# Patient Record
Sex: Female | Born: 1940
Health system: Southern US, Community
[De-identification: ages and names within clinical notes are randomized; demographics above are authoritative.]

## PROBLEM LIST (undated history)

## (undated) DIAGNOSIS — E785 Hyperlipidemia, unspecified: Secondary | ICD-10-CM

## (undated) DIAGNOSIS — I10 Essential (primary) hypertension: Secondary | ICD-10-CM

## (undated) DIAGNOSIS — R079 Chest pain, unspecified: Secondary | ICD-10-CM

## (undated) HISTORY — DX: Chest pain, unspecified: R07.9

## (undated) HISTORY — DX: Hyperlipidemia, unspecified: E78.5

## (undated) HISTORY — DX: Essential (primary) hypertension: I10

---

## 2012-02-22 DIAGNOSIS — L981 Factitial dermatitis: Secondary | ICD-10-CM | POA: Diagnosis not present

## 2012-02-22 DIAGNOSIS — H01009 Unspecified blepharitis unspecified eye, unspecified eyelid: Secondary | ICD-10-CM | POA: Diagnosis not present

## 2012-03-07 DIAGNOSIS — E559 Vitamin D deficiency, unspecified: Secondary | ICD-10-CM | POA: Diagnosis not present

## 2012-03-07 DIAGNOSIS — Z23 Encounter for immunization: Secondary | ICD-10-CM | POA: Diagnosis not present

## 2012-03-07 DIAGNOSIS — E782 Mixed hyperlipidemia: Secondary | ICD-10-CM | POA: Diagnosis not present

## 2012-03-07 DIAGNOSIS — Z79899 Other long term (current) drug therapy: Secondary | ICD-10-CM | POA: Diagnosis not present

## 2012-03-07 DIAGNOSIS — E039 Hypothyroidism, unspecified: Secondary | ICD-10-CM | POA: Diagnosis not present

## 2012-03-11 DIAGNOSIS — Z1211 Encounter for screening for malignant neoplasm of colon: Secondary | ICD-10-CM | POA: Diagnosis not present

## 2012-03-21 DIAGNOSIS — Z1231 Encounter for screening mammogram for malignant neoplasm of breast: Secondary | ICD-10-CM | POA: Diagnosis not present

## 2012-03-29 DIAGNOSIS — Z1211 Encounter for screening for malignant neoplasm of colon: Secondary | ICD-10-CM | POA: Diagnosis not present

## 2012-03-31 DIAGNOSIS — R928 Other abnormal and inconclusive findings on diagnostic imaging of breast: Secondary | ICD-10-CM | POA: Diagnosis not present

## 2012-06-04 DIAGNOSIS — N3000 Acute cystitis without hematuria: Secondary | ICD-10-CM | POA: Diagnosis not present

## 2012-06-06 DIAGNOSIS — K648 Other hemorrhoids: Secondary | ICD-10-CM | POA: Diagnosis not present

## 2012-06-06 DIAGNOSIS — D126 Benign neoplasm of colon, unspecified: Secondary | ICD-10-CM | POA: Diagnosis not present

## 2012-06-06 DIAGNOSIS — K5730 Diverticulosis of large intestine without perforation or abscess without bleeding: Secondary | ICD-10-CM | POA: Diagnosis not present

## 2012-06-06 DIAGNOSIS — E039 Hypothyroidism, unspecified: Secondary | ICD-10-CM | POA: Diagnosis not present

## 2012-06-06 DIAGNOSIS — Z1211 Encounter for screening for malignant neoplasm of colon: Secondary | ICD-10-CM | POA: Diagnosis not present

## 2012-06-24 DIAGNOSIS — Z79899 Other long term (current) drug therapy: Secondary | ICD-10-CM | POA: Diagnosis not present

## 2012-06-24 DIAGNOSIS — E559 Vitamin D deficiency, unspecified: Secondary | ICD-10-CM | POA: Diagnosis not present

## 2012-10-20 DIAGNOSIS — L570 Actinic keratosis: Secondary | ICD-10-CM | POA: Diagnosis not present

## 2012-11-10 DIAGNOSIS — C4441 Basal cell carcinoma of skin of scalp and neck: Secondary | ICD-10-CM | POA: Diagnosis not present

## 2012-12-21 DIAGNOSIS — I209 Angina pectoris, unspecified: Secondary | ICD-10-CM | POA: Diagnosis not present

## 2013-01-25 DIAGNOSIS — R0602 Shortness of breath: Secondary | ICD-10-CM | POA: Diagnosis not present

## 2013-01-25 DIAGNOSIS — I10 Essential (primary) hypertension: Secondary | ICD-10-CM | POA: Diagnosis not present

## 2013-01-26 ENCOUNTER — Other Ambulatory Visit (HOSPITAL_COMMUNITY): Payer: Self-pay | Admitting: Cardiovascular Disease

## 2013-01-26 DIAGNOSIS — R079 Chest pain, unspecified: Secondary | ICD-10-CM

## 2013-01-26 DIAGNOSIS — R0602 Shortness of breath: Secondary | ICD-10-CM

## 2013-01-26 DIAGNOSIS — R0989 Other specified symptoms and signs involving the circulatory and respiratory systems: Secondary | ICD-10-CM

## 2013-01-31 ENCOUNTER — Ambulatory Visit (HOSPITAL_COMMUNITY)
Admission: RE | Admit: 2013-01-31 | Discharge: 2013-01-31 | Disposition: A | Discharge status: Home / Self Care | Payer: Medicare Other | Source: Ambulatory Visit | Attending: Cardiovascular Disease | Admitting: Cardiovascular Disease

## 2013-01-31 DIAGNOSIS — I519 Heart disease, unspecified: Secondary | ICD-10-CM | POA: Diagnosis not present

## 2013-01-31 DIAGNOSIS — I379 Nonrheumatic pulmonary valve disorder, unspecified: Secondary | ICD-10-CM | POA: Diagnosis not present

## 2013-01-31 DIAGNOSIS — I10 Essential (primary) hypertension: Secondary | ICD-10-CM | POA: Insufficient documentation

## 2013-01-31 DIAGNOSIS — F17200 Nicotine dependence, unspecified, uncomplicated: Secondary | ICD-10-CM | POA: Diagnosis not present

## 2013-01-31 DIAGNOSIS — R0602 Shortness of breath: Secondary | ICD-10-CM | POA: Diagnosis not present

## 2013-01-31 DIAGNOSIS — R0989 Other specified symptoms and signs involving the circulatory and respiratory systems: Secondary | ICD-10-CM | POA: Diagnosis not present

## 2013-01-31 DIAGNOSIS — R0609 Other forms of dyspnea: Secondary | ICD-10-CM | POA: Diagnosis not present

## 2013-01-31 NOTE — Progress Notes (Signed)
Carotid artery duplex doppler exam was completed. Larene Pickett RVT

## 2013-01-31 NOTE — Progress Notes (Signed)
2D Echo Performed 01/31/2013    Freeland Pracht, RCS  

## 2013-02-03 ENCOUNTER — Ambulatory Visit (HOSPITAL_COMMUNITY)
Admission: RE | Admit: 2013-02-03 | Discharge: 2013-02-03 | Disposition: A | Discharge status: Home / Self Care | Payer: Medicare Other | Source: Ambulatory Visit | Attending: Cardiovascular Disease | Admitting: Cardiovascular Disease

## 2013-02-03 DIAGNOSIS — R9439 Abnormal result of other cardiovascular function study: Secondary | ICD-10-CM | POA: Insufficient documentation

## 2013-02-03 DIAGNOSIS — R0602 Shortness of breath: Secondary | ICD-10-CM | POA: Diagnosis not present

## 2013-02-03 DIAGNOSIS — E663 Overweight: Secondary | ICD-10-CM | POA: Diagnosis not present

## 2013-02-03 DIAGNOSIS — R0989 Other specified symptoms and signs involving the circulatory and respiratory systems: Secondary | ICD-10-CM | POA: Insufficient documentation

## 2013-02-03 DIAGNOSIS — R079 Chest pain, unspecified: Secondary | ICD-10-CM | POA: Diagnosis not present

## 2013-02-03 DIAGNOSIS — I10 Essential (primary) hypertension: Secondary | ICD-10-CM | POA: Insufficient documentation

## 2013-02-03 DIAGNOSIS — R0609 Other forms of dyspnea: Secondary | ICD-10-CM | POA: Diagnosis not present

## 2013-02-03 MED ORDER — TECHNETIUM TC 99M SESTAMIBI GENERIC - CARDIOLITE
10.0000 | Freq: Once | INTRAVENOUS | Status: AC | PRN
  Administered 2013-02-03: 10 via INTRAVENOUS

## 2013-02-03 MED ORDER — TECHNETIUM TC 99M SESTAMIBI GENERIC - CARDIOLITE
30.0000 | Freq: Once | INTRAVENOUS | Status: AC | PRN
  Administered 2013-02-03: 30 via INTRAVENOUS

## 2013-02-03 NOTE — Procedures (Addendum)
[facility] [address] [place] [address] [place] [address] [phone]  Cardiology Nuclear Med Study  Elizabeth Brown is Brown 72 y.o. female     MRN : [id number]     DOB: 04-17-1941  Procedure Date: 02/03/2013  Nuclear Med Background Indication for Stress Test:  Evaluation for Ischemia History:  NO PRIOR CARDIAC HISTORY Cardiac Risk Factors: Family History - CAD, History of Smoking, Hypertension and Overweight  Symptoms:  Chest Pain, DOE and SOB   Nuclear Pre-Procedure Caffeine/Decaff Intake:  7:00pm NPO After: 5:00am   IV Site: R Antecubital  IV 0.9% NS with Angio Cath:  22g  Chest Size (in):  n/Brown IV Started by: Emmit Pomfret, RN  Height: 5\' 2"  (1.575 m)  Cup Size: D  BMI:  Body mass index is 25.97 kg/(m^2). Weight:  142 lb (64.411 kg)   Tech Comments:  N/Brown    Nuclear Med Study 1 or 2 day study: 1 day  Stress Test Type:  Stress  Order Authorizing Provider:  Nanetta Batty, MD   Resting Radionuclide: Technetium 28m Sestamibi  Resting Radionuclide Dose: 10.5 mCi   Stress Radionuclide:  Technetium 70m Sestamibi  Stress Radionuclide Dose: 31.0 mCi           Stress Protocol Rest HR: 73 Stress HR: 139  Rest BP: 172/79 Stress BP: 210/81  Exercise Time (min): 5:30 METS: 7.0   Predicted Max HR: 149 bpm % Max HR: 93.29 bpm Rate Pressure Product: 36644  Dose of Adenosine (mg):  n/Brown Dose of Lexiscan: n/Brown mg  Dose of Atropine (mg): n/Brown Dose of Dobutamine: n/Brown mcg/kg/min (at max HR)  Stress Test Technologist: Esperanza Sheets, CCT Nuclear Technologist: Koren Shiver, CNMT   Rest Procedure:  Myocardial perfusion imaging was performed at rest 45 minutes following the intravenous administration of Technetium 19m Sestamibi. Stress Procedure:  The patient performed treadmill exercise using Brown Bruce  Protocol for 5:30 minutes. The patient stopped due to SOB and Chest Pain. There were significant ST-T wave changes.  Technetium 57m Sestamibi was  injected at peak exercise and myocardial perfusion imaging was performed after Brown brief delay.  Transient Ischemic Dilatation (Normal <1.22):  1.03 Lung/Heart Ratio (Normal <0.45):  0.34 QGS EDV:  36 ml QGS ESV:  5 ml LV Ejection Fraction: 85%  Signed by      Rest ECG: NSR - Normal EKG  Stress ECG: 1-2 mm horizontal to upsloping ST depression which became downsloping in recovery.  QPS Raw Data Images:  Normal; no motion artifact; normal heart/lung ratio. Stress Images:  Normal homogeneous uptake in all areas of the myocardium. Rest Images:  Normal homogeneous uptake in all areas of the myocardium. Subtraction (SDS):  Normal  Impression Exercise Capacity:  Fair exercise capacity. BP Response:  Hypertensive blood pressure response. Clinical Symptoms:  Mild chest pain/dyspnea. ECG Impression:  Significant ST abnormalities consistent with ischemia. Comparison with Prior Nuclear Study: No previous nuclear study performed  Overall Impression:  Abnormal exercise nuclear study demonstrating mild exercise induced chest pain with ECG changes suggestive of ischemia but with normal scintigraphic images. Consider further evaluation with CPMet test to evaluate for possible microvascular angina versus other diagnostic evaluation.  LV Wall Motion:  NL LV Function, EF hyperdynamic at 85%; NL Wall Motion   Elizabeth Vachon A, MD  02/03/2013 1:50 PM

## 2013-02-16 DIAGNOSIS — I10 Essential (primary) hypertension: Secondary | ICD-10-CM | POA: Diagnosis not present

## 2013-04-25 DIAGNOSIS — Z1231 Encounter for screening mammogram for malignant neoplasm of breast: Secondary | ICD-10-CM | POA: Diagnosis not present

## 2013-08-07 DIAGNOSIS — L219 Seborrheic dermatitis, unspecified: Secondary | ICD-10-CM | POA: Diagnosis not present

## 2013-08-07 DIAGNOSIS — L301 Dyshidrosis [pompholyx]: Secondary | ICD-10-CM | POA: Diagnosis not present

## 2013-08-09 DIAGNOSIS — R5381 Other malaise: Secondary | ICD-10-CM | POA: Diagnosis not present

## 2013-08-09 DIAGNOSIS — E039 Hypothyroidism, unspecified: Secondary | ICD-10-CM | POA: Diagnosis not present

## 2013-08-09 DIAGNOSIS — Z79899 Other long term (current) drug therapy: Secondary | ICD-10-CM | POA: Diagnosis not present

## 2013-08-09 DIAGNOSIS — E559 Vitamin D deficiency, unspecified: Secondary | ICD-10-CM | POA: Diagnosis not present

## 2013-08-09 DIAGNOSIS — E7800 Pure hypercholesterolemia, unspecified: Secondary | ICD-10-CM | POA: Diagnosis not present

## 2013-08-09 DIAGNOSIS — Z23 Encounter for immunization: Secondary | ICD-10-CM | POA: Diagnosis not present

## 2013-08-21 DIAGNOSIS — Z1211 Encounter for screening for malignant neoplasm of colon: Secondary | ICD-10-CM | POA: Diagnosis not present

## 2013-08-21 DIAGNOSIS — K219 Gastro-esophageal reflux disease without esophagitis: Secondary | ICD-10-CM | POA: Diagnosis not present

## 2013-08-21 DIAGNOSIS — I10 Essential (primary) hypertension: Secondary | ICD-10-CM | POA: Diagnosis not present

## 2013-08-21 DIAGNOSIS — Z0000 Encounter for general adult medical examination without abnormal findings: Secondary | ICD-10-CM | POA: Diagnosis not present

## 2013-08-21 DIAGNOSIS — E039 Hypothyroidism, unspecified: Secondary | ICD-10-CM | POA: Diagnosis not present

## 2014-09-04 DIAGNOSIS — Z79899 Other long term (current) drug therapy: Secondary | ICD-10-CM | POA: Diagnosis not present

## 2014-09-04 DIAGNOSIS — Z0000 Encounter for general adult medical examination without abnormal findings: Secondary | ICD-10-CM | POA: Diagnosis not present

## 2014-09-04 DIAGNOSIS — E780 Pure hypercholesterolemia: Secondary | ICD-10-CM | POA: Diagnosis not present

## 2014-09-04 DIAGNOSIS — Z23 Encounter for immunization: Secondary | ICD-10-CM | POA: Diagnosis not present

## 2014-09-10 DIAGNOSIS — I10 Essential (primary) hypertension: Secondary | ICD-10-CM | POA: Diagnosis not present

## 2014-09-10 DIAGNOSIS — M8580 Other specified disorders of bone density and structure, unspecified site: Secondary | ICD-10-CM | POA: Diagnosis not present

## 2014-09-10 DIAGNOSIS — R8299 Other abnormal findings in urine: Secondary | ICD-10-CM | POA: Diagnosis not present

## 2014-09-10 DIAGNOSIS — Z0000 Encounter for general adult medical examination without abnormal findings: Secondary | ICD-10-CM | POA: Diagnosis not present

## 2014-09-10 DIAGNOSIS — E039 Hypothyroidism, unspecified: Secondary | ICD-10-CM | POA: Diagnosis not present

## 2014-09-10 DIAGNOSIS — E780 Pure hypercholesterolemia: Secondary | ICD-10-CM | POA: Diagnosis not present

## 2014-09-10 DIAGNOSIS — K219 Gastro-esophageal reflux disease without esophagitis: Secondary | ICD-10-CM | POA: Diagnosis not present

## 2014-09-12 DIAGNOSIS — Z1211 Encounter for screening for malignant neoplasm of colon: Secondary | ICD-10-CM | POA: Diagnosis not present

## 2014-09-13 DIAGNOSIS — Z0000 Encounter for general adult medical examination without abnormal findings: Secondary | ICD-10-CM | POA: Diagnosis not present

## 2014-09-13 DIAGNOSIS — N3000 Acute cystitis without hematuria: Secondary | ICD-10-CM | POA: Diagnosis not present

## 2015-03-08 DIAGNOSIS — N3091 Cystitis, unspecified with hematuria: Secondary | ICD-10-CM | POA: Diagnosis not present

## 2015-03-08 DIAGNOSIS — N390 Urinary tract infection, site not specified: Secondary | ICD-10-CM | POA: Diagnosis not present

## 2015-07-02 ENCOUNTER — Encounter: Payer: Self-pay | Admitting: Cardiovascular Disease

## 2015-08-14 DIAGNOSIS — L209 Atopic dermatitis, unspecified: Secondary | ICD-10-CM | POA: Diagnosis not present

## 2015-08-14 DIAGNOSIS — B079 Viral wart, unspecified: Secondary | ICD-10-CM | POA: Diagnosis not present

## 2015-08-14 DIAGNOSIS — L299 Pruritus, unspecified: Secondary | ICD-10-CM | POA: Diagnosis not present

## 2015-08-20 DIAGNOSIS — Z23 Encounter for immunization: Secondary | ICD-10-CM | POA: Diagnosis not present

## 2015-09-10 DIAGNOSIS — Z1231 Encounter for screening mammogram for malignant neoplasm of breast: Secondary | ICD-10-CM | POA: Diagnosis not present

## 2015-09-17 DIAGNOSIS — Z79899 Other long term (current) drug therapy: Secondary | ICD-10-CM | POA: Diagnosis not present

## 2015-09-17 DIAGNOSIS — K219 Gastro-esophageal reflux disease without esophagitis: Secondary | ICD-10-CM | POA: Diagnosis not present

## 2015-09-17 DIAGNOSIS — E559 Vitamin D deficiency, unspecified: Secondary | ICD-10-CM | POA: Diagnosis not present

## 2015-09-17 DIAGNOSIS — E782 Mixed hyperlipidemia: Secondary | ICD-10-CM | POA: Diagnosis not present

## 2015-09-19 DIAGNOSIS — E7800 Pure hypercholesterolemia, unspecified: Secondary | ICD-10-CM | POA: Diagnosis not present

## 2015-09-19 DIAGNOSIS — Z0000 Encounter for general adult medical examination without abnormal findings: Secondary | ICD-10-CM | POA: Diagnosis not present

## 2015-09-19 DIAGNOSIS — E039 Hypothyroidism, unspecified: Secondary | ICD-10-CM | POA: Diagnosis not present

## 2015-09-19 DIAGNOSIS — I10 Essential (primary) hypertension: Secondary | ICD-10-CM | POA: Diagnosis not present

## 2015-09-19 DIAGNOSIS — N3000 Acute cystitis without hematuria: Secondary | ICD-10-CM | POA: Diagnosis not present

## 2015-09-26 DIAGNOSIS — Z1211 Encounter for screening for malignant neoplasm of colon: Secondary | ICD-10-CM | POA: Diagnosis not present

## 2015-12-20 ENCOUNTER — Ambulatory Visit (INDEPENDENT_AMBULATORY_CARE_PROVIDER_SITE_OTHER): Payer: Medicare Other | Admitting: Cardiovascular Disease

## 2015-12-20 ENCOUNTER — Encounter: Payer: Self-pay | Admitting: Cardiovascular Disease

## 2015-12-20 VITALS — BP 152/76 | HR 64 | Ht 62.5 in | Wt 137.3 lb

## 2015-12-20 DIAGNOSIS — R072 Precordial pain: Secondary | ICD-10-CM

## 2015-12-20 DIAGNOSIS — E785 Hyperlipidemia, unspecified: Secondary | ICD-10-CM

## 2015-12-20 DIAGNOSIS — R0602 Shortness of breath: Secondary | ICD-10-CM

## 2015-12-20 DIAGNOSIS — I10 Essential (primary) hypertension: Secondary | ICD-10-CM | POA: Diagnosis not present

## 2015-12-20 DIAGNOSIS — R079 Chest pain, unspecified: Secondary | ICD-10-CM

## 2015-12-20 NOTE — Patient Instructions (Signed)
Schedule Stress Myoview    Your physician wants you to follow-up in: 1 year. You will receive a reminder letter in the mail two months in advance. If you don't receive a letter, please call our office to schedule the follow-up appointment.

## 2015-12-20 NOTE — Assessment & Plan Note (Signed)
Mrs. Hroncich has new onset chest pain on exertion beginning of present 1 month ago. It occurs every couple days usually with mild to moderate exertion. There is radiation to her left neck and shoulder arm and back.she did have a negative Myoview stress test in our office 02/03/13 for chest pain although she says the pain now is more intense and with less activity. I'm going to obtain a exercise Myoview stress test to further evaluate this.

## 2015-12-20 NOTE — Assessment & Plan Note (Signed)
History of hypertension with blood pressure measured today at 152/76. She is on amlodipine and metoprolol. Continue current meds at current dosing

## 2015-12-20 NOTE — Assessment & Plan Note (Signed)
History of hyperlipidemia not on statin therapy with recent lipid profile performed by her PCP 09/17/15 revealing total cholesterol 194, LDL 121 and HDL of 55.

## 2015-12-20 NOTE — Progress Notes (Signed)
12/20/2015 Elizabeth Brown   [birth year]  [id number]  Primary Physician No primary care provider on file. Primary Cardiologist: Lorretta Harp MD Renae Gloss   HPI:  Miss Elizabeth Brown is a 75 year old married Caucasian female with 2 children whose husband Elizabeth Brown is also a patient of mine. I last saw her approximately 2-1/2 years ago. She has a history of treated hypertension. She has mild hyperlipidemia not on statin therapy. Her father did die of a myocardial infarction at age 27. She has never had a heart attack or stroke. She had a negative Myoview stress test in our office 01/26/13 as well as a normal 2-D echocardiogram. Carotid Dopplers performed at the same time showed high-grade left external carotid artery stenosis done because of an auscultated bruit.Marland Kitchen She's had new onset exertional chest pain approximately one month ago occurring several times a week reproducible with mild to moderate exertion. The discomfort radiates into her left shoulder arm back and neck.   Current Outpatient Prescriptions  Medication Sig Dispense Refill  . amLODipine (NORVASC) 5 MG tablet Take 5 mg by mouth daily. Take 1 tab by mouth daily  3  . aspirin EC 81 MG tablet Take 81 mg by mouth daily.    . metoprolol succinate (TOPROL-XL) 50 MG 24 hr tablet Take 1 tablet by mouth daily. Take 1 tab by mouth daily    . nitroGLYCERIN (NITROSTAT) 0.4 MG SL tablet Place 1 tablet under the tongue as needed. Place 1 tab under tongue if needed for chest pain  Repeat every 5 minutes up to 3 doses  1  . pantoprazole (PROTONIX) 40 MG tablet Take 1 tablet by mouth daily. Take 1 tab by mouth daily    . SYNTHROID 75 MCG tablet Take 1 tablet by mouth daily. Take 1 tab by mouth daily    . vitamin C (ASCORBIC ACID) 500 MG tablet Take 500 mg by mouth daily.     No current facility-administered medications for this visit.    Allergies  Allergen Reactions  . Shellfish Allergy Hives  . Sulfa Antibiotics Hives  . Lisinopril Rash     Social History   Social History  . Marital Status: Unknown    Spouse Name: N/A  . Number of Children: N/A  . Years of Education: N/A   Occupational History  . Not on file.   Social History Main Topics  . Smoking status: Former Smoker    Quit date: 12/19/1990  . Smokeless tobacco: Never Used  . Alcohol Use: Not on file  . Drug Use: Not on file  . Sexual Activity: Not on file   Other Topics Concern  . Not on file   Social History Narrative  . No narrative on file     Review of Systems: General: negative for chills, fever, night sweats or weight changes.  Cardiovascular: negative for chest pain, dyspnea on exertion, edema, orthopnea, palpitations, paroxysmal nocturnal dyspnea or shortness of breath Dermatological: negative for rash Respiratory: negative for cough or wheezing Urologic: negative for hematuria Abdominal: negative for nausea, vomiting, diarrhea, bright red blood per rectum, melena, or hematemesis Neurologic: negative for visual changes, syncope, or dizziness All other systems reviewed and are otherwise negative except as noted above.    Blood pressure 152/76, pulse 64, height 5' 2.5" (1.588 m), weight 137 lb 5 oz (62.285 kg).  General appearance: alert and no distress Neck: no adenopathy, no JVD, supple, symmetrical, trachea midline, thyroid not enlarged, symmetric, no tenderness/mass/nodules and left carotid bruit Lungs:  clear to auscultation bilaterally Heart: regular rate and rhythm, S1, S2 normal, no murmur, click, rub or gallop Extremities: extremities normal, atraumatic, no cyanosis or edema  EKG normal sinus rhythm with 65 without ST or T-wave changes. I personally reviewed this EKG  ASSESSMENT AND PLAN:   Essential hypertension History of hypertension with blood pressure measured today at 152/76. She is on amlodipine and metoprolol. Continue current meds at current dosing  Hyperlipidemia History of hyperlipidemia not on statin therapy with  recent lipid profile performed by her PCP 09/17/15 revealing total cholesterol 194, LDL 121 and HDL of 55.  Chest pain on exertion Mrs. Elizabeth Brown has new onset chest pain on exertion beginning of present 1 month ago. It occurs every couple days usually with mild to moderate exertion. There is radiation to her left neck and shoulder arm and back.she did have a negative Myoview stress test in our office 02/03/13 for chest pain although she says the pain now is more intense and with less activity. I'm going to obtain a exercise Myoview stress test to further evaluate this.      Lorretta Harp MD FACP,FACC,FAHA, [facility] 12/20/2015 8:59 AM

## 2015-12-24 ENCOUNTER — Telehealth: Payer: Self-pay | Admitting: Cardiovascular Disease

## 2015-12-24 ENCOUNTER — Telehealth (HOSPITAL_COMMUNITY): Payer: Self-pay

## 2015-12-24 NOTE — Telephone Encounter (Signed)
New message   Pt wants RN tp call her back she is unclear on the medications   that she soul and should not take Manson

## 2015-12-24 NOTE — Telephone Encounter (Signed)
Instructed pt on holding metoprolol prior to stress test. Pt voiced understanding of instructions.

## 2015-12-24 NOTE — Telephone Encounter (Signed)
Encounter complete. 

## 2015-12-26 ENCOUNTER — Ambulatory Visit (HOSPITAL_COMMUNITY)
Admission: RE | Admit: 2015-12-26 | Discharge: 2015-12-26 | Disposition: A | Discharge status: Home / Self Care | Payer: Medicare Other | Source: Ambulatory Visit | Attending: Cardiology | Admitting: Cardiology

## 2015-12-26 DIAGNOSIS — R0609 Other forms of dyspnea: Secondary | ICD-10-CM | POA: Insufficient documentation

## 2015-12-26 DIAGNOSIS — Z87891 Personal history of nicotine dependence: Secondary | ICD-10-CM | POA: Diagnosis not present

## 2015-12-26 DIAGNOSIS — R0602 Shortness of breath: Secondary | ICD-10-CM | POA: Diagnosis not present

## 2015-12-26 DIAGNOSIS — I10 Essential (primary) hypertension: Secondary | ICD-10-CM | POA: Insufficient documentation

## 2015-12-26 DIAGNOSIS — I739 Peripheral vascular disease, unspecified: Secondary | ICD-10-CM | POA: Insufficient documentation

## 2015-12-26 DIAGNOSIS — Z8249 Family history of ischemic heart disease and other diseases of the circulatory system: Secondary | ICD-10-CM | POA: Insufficient documentation

## 2015-12-26 DIAGNOSIS — R072 Precordial pain: Secondary | ICD-10-CM | POA: Diagnosis not present

## 2015-12-26 DIAGNOSIS — I779 Disorder of arteries and arterioles, unspecified: Secondary | ICD-10-CM | POA: Diagnosis not present

## 2015-12-26 LAB — MYOCARDIAL PERFUSION IMAGING
CHL CUP MPHR: 146 {beats}/min
CHL CUP NUCLEAR SRS: 8
CHL CUP NUCLEAR SSS: 13
Estimated workload: 7 METS
Exercise duration (min): 6 min
LV sys vol: 8 mL
LVDIAVOL: 41 mL
NUC STRESS TID: 1.15
Peak HR: 133 {beats}/min
Percent HR: 91 %
RPE: 16
Rest HR: 79 {beats}/min
SDS: 5

## 2015-12-26 MED ORDER — TECHNETIUM TC 99M SESTAMIBI GENERIC - CARDIOLITE
9.9000 | Freq: Once | INTRAVENOUS | Status: AC | PRN
Start: 2015-12-26 — End: 2015-12-26
  Administered 2015-12-26: 9.9 via INTRAVENOUS

## 2015-12-26 MED ORDER — TECHNETIUM TC 99M SESTAMIBI GENERIC - CARDIOLITE
31.0000 | Freq: Once | INTRAVENOUS | Status: AC | PRN
  Administered 2015-12-26: 31 via INTRAVENOUS

## 2016-01-01 ENCOUNTER — Telehealth: Payer: Self-pay | Admitting: Cardiovascular Disease

## 2016-01-01 NOTE — Telephone Encounter (Signed)
Pt advised on normal results. Understanding verbalized.

## 2016-01-01 NOTE — Telephone Encounter (Signed)
New message ° ° ° ° ° °Calling to get stress test results °

## 2016-07-14 ENCOUNTER — Other Ambulatory Visit: Payer: Self-pay

## 2016-08-25 DIAGNOSIS — Z23 Encounter for immunization: Secondary | ICD-10-CM | POA: Diagnosis not present

## 2016-09-21 DIAGNOSIS — E785 Hyperlipidemia, unspecified: Secondary | ICD-10-CM | POA: Diagnosis not present

## 2016-09-21 DIAGNOSIS — Z79899 Other long term (current) drug therapy: Secondary | ICD-10-CM | POA: Diagnosis not present

## 2016-09-21 DIAGNOSIS — E039 Hypothyroidism, unspecified: Secondary | ICD-10-CM | POA: Diagnosis not present

## 2016-09-21 DIAGNOSIS — R300 Dysuria: Secondary | ICD-10-CM | POA: Diagnosis not present

## 2016-09-21 DIAGNOSIS — M8580 Other specified disorders of bone density and structure, unspecified site: Secondary | ICD-10-CM | POA: Diagnosis not present

## 2016-09-21 DIAGNOSIS — E559 Vitamin D deficiency, unspecified: Secondary | ICD-10-CM | POA: Diagnosis not present

## 2016-09-21 DIAGNOSIS — E7800 Pure hypercholesterolemia, unspecified: Secondary | ICD-10-CM | POA: Diagnosis not present

## 2016-09-21 DIAGNOSIS — I10 Essential (primary) hypertension: Secondary | ICD-10-CM | POA: Diagnosis not present

## 2016-09-21 DIAGNOSIS — Z0000 Encounter for general adult medical examination without abnormal findings: Secondary | ICD-10-CM | POA: Diagnosis not present

## 2016-09-23 DIAGNOSIS — Z0000 Encounter for general adult medical examination without abnormal findings: Secondary | ICD-10-CM | POA: Diagnosis not present

## 2016-09-23 DIAGNOSIS — E559 Vitamin D deficiency, unspecified: Secondary | ICD-10-CM | POA: Diagnosis not present

## 2016-12-15 DIAGNOSIS — Z1231 Encounter for screening mammogram for malignant neoplasm of breast: Secondary | ICD-10-CM | POA: Diagnosis not present

## 2017-03-26 ENCOUNTER — Ambulatory Visit (INDEPENDENT_AMBULATORY_CARE_PROVIDER_SITE_OTHER): Payer: Medicare Other | Admitting: Cardiovascular Disease

## 2017-03-26 ENCOUNTER — Encounter: Payer: Self-pay | Admitting: Cardiovascular Disease

## 2017-03-26 VITALS — BP 140/74 | HR 72 | Ht 62.0 in | Wt 146.0 lb

## 2017-03-26 DIAGNOSIS — R0989 Other specified symptoms and signs involving the circulatory and respiratory systems: Secondary | ICD-10-CM

## 2017-03-26 DIAGNOSIS — I10 Essential (primary) hypertension: Secondary | ICD-10-CM | POA: Diagnosis not present

## 2017-03-26 DIAGNOSIS — R079 Chest pain, unspecified: Secondary | ICD-10-CM | POA: Diagnosis not present

## 2017-03-26 DIAGNOSIS — E7800 Pure hypercholesterolemia, unspecified: Secondary | ICD-10-CM

## 2017-03-26 NOTE — Assessment & Plan Note (Signed)
History of hyperlipidemia not on statin therapy followed by her PCP 

## 2017-03-26 NOTE — Assessment & Plan Note (Signed)
History of essential hypertension blood pressure measured at 140/74. She is on amlodipine and metoprolol. Continue current meds at current dosing.

## 2017-03-26 NOTE — Progress Notes (Signed)
03/26/2017 Elizabeth Brown   01/10/1941  [id number]  Primary Physician Myer Peer, MD Primary Cardiologist: Lorretta Harp MD Renae Gloss  HPI:  Elizabeth Brown is a 76 year old married Caucasian female with 2 children whose husband [name] is also a patient of mine. I last saw her in the office 12/20/15 She has a history of treated hypertension. She has mild hyperlipidemia not on statin therapy. Her father did die of a myocardial infarction at age 50. She has never had a heart attack or stroke. She had a negative Myoview stress test in our office 01/26/13 as well as a normal 2-D echocardiogram. Carotid Dopplers performed at the same time showed high-grade left external carotid artery stenosis done because of an auscultated bruit.Marland Kitchen She's had new onset exertional chest pain approximately one month ago occurring several times a week reproducible with mild to moderate exertion. The discomfort radiated into her left shoulder arm back and neck. I perform Myoview stress testing 12/26/15 which was entirely normal. She has had no recurrent symptoms.   Current Outpatient Prescriptions  Medication Sig Dispense Refill  . amLODipine (NORVASC) 5 MG tablet Take 5 mg by mouth daily. Take 1 tab by mouth daily  3  . aspirin EC 81 MG tablet Take 81 mg by mouth daily.    . metoprolol succinate (TOPROL-XL) 50 MG 24 hr tablet Take 1 tablet by mouth daily. Take 1 tab by mouth daily    . nitroGLYCERIN (NITROSTAT) 0.4 MG SL tablet Place 1 tablet under the tongue as needed. Place 1 tab under tongue if needed for chest pain  Repeat every 5 minutes up to 3 doses  1  . pantoprazole (PROTONIX) 40 MG tablet Take 1 tablet by mouth daily. Take 1 tab by mouth daily    . SYNTHROID 75 MCG tablet Take 1 tablet by mouth daily. Take 1 tab by mouth daily    . vitamin C (ASCORBIC ACID) 500 MG tablet Take 500 mg by mouth daily.     No current facility-administered medications for this visit.     Allergies  Allergen Reactions  .  Sulfa Antibiotics Hives  . Altace [Ramipril] Other (See Comments) and Cough  . Lisinopril Rash  . Shellfish Allergy Hives    Social History   Social History  . Marital status: Unknown    Spouse name: N/A  . Number of children: N/A  . Years of education: N/A   Occupational History  . Not on file.   Social History Main Topics  . Smoking status: Former Smoker    Quit date: 12/19/1990  . Smokeless tobacco: Never Used  . Alcohol use Not on file  . Drug use: Unknown  . Sexual activity: Not on file   Other Topics Concern  . Not on file   Social History Narrative  . No narrative on file     Review of Systems: General: negative for chills, fever, night sweats or weight changes.  Cardiovascular: negative for chest pain, dyspnea on exertion, edema, orthopnea, palpitations, paroxysmal nocturnal dyspnea or shortness of breath Dermatological: negative for rash Respiratory: negative for cough or wheezing Urologic: negative for hematuria Abdominal: negative for nausea, vomiting, diarrhea, bright red blood per rectum, melena, or hematemesis Neurologic: negative for visual changes, syncope, or dizziness All other systems reviewed and are otherwise negative except as noted above.    Blood pressure 140/74, pulse 72, height 5\' 2"  (1.575 m), weight 146 lb (66.2 kg).  General appearance: alert and no  distress Neck: no adenopathy, no JVD, supple, symmetrical, trachea midline, thyroid not enlarged, symmetric, no tenderness/mass/nodules and Loud left, softer right carotid bruit Lungs: clear to auscultation bilaterally Heart: regular rate and rhythm, S1, S2 normal, no murmur, click, rub or gallop Extremities: extremities normal, atraumatic, no cyanosis or edema  EKG sinus rhythm at 72 without ST or T-wave changes. I personally reviewed this EKG  ASSESSMENT AND PLAN:   Essential hypertension History of essential hypertension blood pressure measured at 140/74. She is on amlodipine and  metoprolol. Continue current meds at current dosing.  Hyperlipidemia History of hyperlipidemia not on statin therapy followed by her PCP  Chest pain on exertion History of chest pain last year since the negative Myoview stress test. The pain subsequently resolved spontaneously.  Left carotid bruit History of left carotid bruit Doppler study performed 01/31/13 revealing high-grade left external carotid artery stenosis.      Lorretta Harp MD FACP,FACC,FAHA, [facility] 03/26/2017 1:30 PM

## 2017-03-26 NOTE — Patient Instructions (Signed)
Medication Instructions: Your physician recommends that you continue on your current medications as directed. Please refer to the Current Medication list given to you today.  Labwork: I will request lab work from Dr. Bary Leriche office.  Follow-Up: Your physician wants you to follow-up in: 1 year with Dr. Gwenlyn Found. You will receive a reminder letter in the mail two months in advance. If you don't receive a letter, please call our office to schedule the follow-up appointment.  If you need a refill on your cardiac medications before your next appointment, please call your pharmacy.

## 2017-03-26 NOTE — Assessment & Plan Note (Signed)
History of left carotid bruit Doppler study performed 01/31/13 revealing high-grade left external carotid artery stenosis.

## 2017-03-26 NOTE — Assessment & Plan Note (Signed)
History of chest pain last year since the negative Myoview stress test. The pain subsequently resolved spontaneously.

## 2017-08-30 DIAGNOSIS — Z23 Encounter for immunization: Secondary | ICD-10-CM | POA: Diagnosis not present

## 2017-09-23 DIAGNOSIS — L570 Actinic keratosis: Secondary | ICD-10-CM | POA: Diagnosis not present

## 2017-09-23 DIAGNOSIS — E785 Hyperlipidemia, unspecified: Secondary | ICD-10-CM | POA: Diagnosis not present

## 2017-09-23 DIAGNOSIS — L820 Inflamed seborrheic keratosis: Secondary | ICD-10-CM | POA: Diagnosis not present

## 2017-09-23 DIAGNOSIS — Z79899 Other long term (current) drug therapy: Secondary | ICD-10-CM | POA: Diagnosis not present

## 2017-09-23 DIAGNOSIS — Z0000 Encounter for general adult medical examination without abnormal findings: Secondary | ICD-10-CM | POA: Diagnosis not present

## 2017-09-23 DIAGNOSIS — E039 Hypothyroidism, unspecified: Secondary | ICD-10-CM | POA: Diagnosis not present

## 2017-09-28 DIAGNOSIS — Z1211 Encounter for screening for malignant neoplasm of colon: Secondary | ICD-10-CM | POA: Diagnosis not present

## 2018-04-05 ENCOUNTER — Ambulatory Visit (INDEPENDENT_AMBULATORY_CARE_PROVIDER_SITE_OTHER): Payer: Medicare Other | Admitting: Cardiovascular Disease

## 2018-04-05 ENCOUNTER — Encounter: Payer: Self-pay | Admitting: Cardiovascular Disease

## 2018-04-05 DIAGNOSIS — R079 Chest pain, unspecified: Secondary | ICD-10-CM

## 2018-04-05 DIAGNOSIS — E7800 Pure hypercholesterolemia, unspecified: Secondary | ICD-10-CM

## 2018-04-05 DIAGNOSIS — R0989 Other specified symptoms and signs involving the circulatory and respiratory systems: Secondary | ICD-10-CM

## 2018-04-05 DIAGNOSIS — I10 Essential (primary) hypertension: Secondary | ICD-10-CM | POA: Diagnosis not present

## 2018-04-05 NOTE — Progress Notes (Signed)
04/05/2018 Elizabeth Brown   November 19, 1940  [id number]  Primary Physician Myer Peer, MD Primary Cardiologist: Lorretta Harp MD Lupe Carney, [place]  HPI:  Elizabeth Brown is a 77 y.o.  married Caucasian female with 2 children whose husband [name] is also a patient of mine. I last saw her in the office 03/26/2017 She has a history of treated hypertension. She has mild hyperlipidemia not on statin therapy. Her father did die of a myocardial infarction at age 29. She has never had a heart attack or stroke. She had a negative Myoview stress test in our office 01/26/13 as well as a normal 2-D echocardiogram. Carotid Dopplers performed at the same time showed high-grade left external carotid artery stenosis done because of an auscultated bruit. She's had new onset exertional chest pain approximately one month ago occurring several times a week reproducible with mild to moderate exertion. The discomfort radiated into her left shoulder arm back and neck. I performed Myoview stress testing 12/26/15 which was entirely normal.     Current Meds  Medication Sig  . amLODipine (NORVASC) 5 MG tablet Take 5 mg by mouth daily. Take 1 tab by mouth daily  . aspirin EC 81 MG tablet Take 81 mg by mouth daily.  . metoprolol succinate (TOPROL-XL) 50 MG 24 hr tablet Take 1 tablet by mouth daily. Take 1 tab by mouth daily  . nitroGLYCERIN (NITROSTAT) 0.4 MG SL tablet Place 1 tablet under the tongue as needed. Place 1 tab under tongue if needed for chest pain  Repeat every 5 minutes up to 3 doses  . pantoprazole (PROTONIX) 40 MG tablet Take 1 tablet by mouth daily. Take 1 tab by mouth daily  . SYNTHROID 75 MCG tablet Take 1 tablet by mouth daily. Take 1 tab by mouth daily  . vitamin C (ASCORBIC ACID) 500 MG tablet Take 500 mg by mouth daily.     Allergies  Allergen Reactions  . Sulfa Antibiotics Hives  . Altace [Ramipril] Other (See Comments) and Cough  . Lisinopril Rash  . Shellfish Allergy Hives    Social History     Socioeconomic History  . Marital status: Unknown    Spouse name: Not on file  . Number of children: Not on file  . Years of education: Not on file  . Highest education level: Not on file  Occupational History  . Not on file  Social Needs  . Financial resource strain: Not on file  . Food insecurity:    Worry: Not on file    Inability: Not on file  . Transportation needs:    Medical: Not on file    Non-medical: Not on file  Tobacco Use  . Smoking status: Former Smoker    Last attempt to quit: 12/19/1990    Years since quitting: 27.3  . Smokeless tobacco: Never Used  Substance and Sexual Activity  . Alcohol use: Not on file  . Drug use: Not on file  . Sexual activity: Not on file  Lifestyle  . Physical activity:    Days per week: Not on file    Minutes per session: Not on file  . Stress: Not on file  Relationships  . Social connections:    Talks on phone: Not on file    Gets together: Not on file    Attends religious service: Not on file    Active member of club or organization: Not on file    Attends meetings of clubs or organizations:  Not on file    Relationship status: Not on file  . Intimate partner violence:    Fear of current or ex partner: Not on file    Emotionally abused: Not on file    Physically abused: Not on file    Forced sexual activity: Not on file  Other Topics Concern  . Not on file  Social History Narrative  . Not on file     Review of Systems: General: negative for chills, fever, night sweats or weight changes.  Cardiovascular: negative for chest pain, dyspnea on exertion, edema, orthopnea, palpitations, paroxysmal nocturnal dyspnea or shortness of breath Dermatological: negative for rash Respiratory: negative for cough or wheezing Urologic: negative for hematuria Abdominal: negative for nausea, vomiting, diarrhea, bright red blood per rectum, melena, or hematemesis Neurologic: negative for visual changes, syncope, or dizziness All other  systems reviewed and are otherwise negative except as noted above.    Blood pressure 134/74, pulse 60, weight 149 lb (67.6 kg).  General appearance: alert and no distress Neck: no adenopathy, no JVD, supple, symmetrical, trachea midline, thyroid not enlarged, symmetric, no tenderness/mass/nodules and Left carotid bruit Lungs: clear to auscultation bilaterally Heart: regular rate and rhythm, S1, S2 normal, no murmur, click, rub or gallop Extremities: extremities normal, atraumatic, no cyanosis or edema Pulses: 2+ and symmetric Skin: Skin color, texture, turgor normal. No rashes or lesions Neurologic: Alert and oriented X 3, normal strength and tone. Normal symmetric reflexes. Normal coordination and gait  EKG sinus rhythm at 60 without ST or T wave changes.  I personally reviewed this EKG.  ASSESSMENT AND PLAN:   Essential hypertension History of essential hypertension her blood pressure measured today at 134/74 she is on amlodipine metoprolol.  Continue current meds at current dosing  Hyperlipidemia History of hyperlipidemia not on statin therapy.  Chest pain on exertion History of exertional chest pain predominantly with walking up hills which has not changed in frequency or severity since I saw her a year ago she did have a Myoview stress test performed 12/26/2015 which was entirely normal.  Left carotid bruit History of left carotid bruit with Dopplers that showed external carotid artery stenosis.      Lorretta Harp MD FACP,FACC,FAHA, [facility] 04/05/2018 3:27 PM

## 2018-04-05 NOTE — Assessment & Plan Note (Signed)
History of left carotid bruit with Dopplers that showed external carotid artery stenosis.

## 2018-04-05 NOTE — Assessment & Plan Note (Signed)
History of exertional chest pain predominantly with walking up hills which has not changed in frequency or severity since I saw her a year ago she did have a Myoview stress test performed 12/26/2015 which was entirely normal.

## 2018-04-05 NOTE — Assessment & Plan Note (Signed)
History of essential hypertension her blood pressure measured today at 134/74 she is on amlodipine metoprolol.  Continue current meds at current dosing

## 2018-04-05 NOTE — Patient Instructions (Signed)

## 2018-04-05 NOTE — Assessment & Plan Note (Signed)
History of hyperlipidemia not on statin therapy. 

## 2018-07-28 DIAGNOSIS — R300 Dysuria: Secondary | ICD-10-CM | POA: Diagnosis not present

## 2018-07-28 DIAGNOSIS — N3001 Acute cystitis with hematuria: Secondary | ICD-10-CM | POA: Diagnosis not present

## 2018-07-28 DIAGNOSIS — R1084 Generalized abdominal pain: Secondary | ICD-10-CM | POA: Diagnosis not present

## 2018-08-12 DIAGNOSIS — Z23 Encounter for immunization: Secondary | ICD-10-CM | POA: Diagnosis not present

## 2018-09-23 ENCOUNTER — Other Ambulatory Visit: Payer: Self-pay

## 2018-09-26 DIAGNOSIS — E785 Hyperlipidemia, unspecified: Secondary | ICD-10-CM | POA: Diagnosis not present

## 2018-09-26 DIAGNOSIS — E039 Hypothyroidism, unspecified: Secondary | ICD-10-CM | POA: Diagnosis not present

## 2018-09-26 DIAGNOSIS — Z79899 Other long term (current) drug therapy: Secondary | ICD-10-CM | POA: Diagnosis not present

## 2018-09-29 DIAGNOSIS — E039 Hypothyroidism, unspecified: Secondary | ICD-10-CM | POA: Diagnosis not present

## 2018-09-29 DIAGNOSIS — E782 Mixed hyperlipidemia: Secondary | ICD-10-CM | POA: Diagnosis not present

## 2018-09-29 DIAGNOSIS — Z0000 Encounter for general adult medical examination without abnormal findings: Secondary | ICD-10-CM | POA: Diagnosis not present

## 2018-09-29 DIAGNOSIS — I10 Essential (primary) hypertension: Secondary | ICD-10-CM | POA: Diagnosis not present

## 2018-11-08 DIAGNOSIS — Z6826 Body mass index (BMI) 26.0-26.9, adult: Secondary | ICD-10-CM | POA: Diagnosis not present

## 2018-11-08 DIAGNOSIS — R29818 Other symptoms and signs involving the nervous system: Secondary | ICD-10-CM | POA: Diagnosis not present

## 2018-11-08 DIAGNOSIS — E782 Mixed hyperlipidemia: Secondary | ICD-10-CM | POA: Diagnosis not present

## 2018-11-08 DIAGNOSIS — I6522 Occlusion and stenosis of left carotid artery: Secondary | ICD-10-CM | POA: Diagnosis not present

## 2018-11-23 NOTE — Progress Notes (Signed)
Cardiology Office Note   Date:  11/24/2018   ID:  Collier Monica, DOB [birth year], MRN [id number]  PCP:  Street, Sharon Mt, MD  Cardiologist:  Dr. .Gwenlyn Found  Chief Complaint  Patient presents with  . Follow-up    TIA IN [place] SIDE DEFICIT NO COMPLAINTS SINCE  . Hypertension  . Dizziness     History of Present Illness: Elizabeth Brown is a 78 y.o. female who presents for ongoing assessment and management of HTN, HL, carotid artery stenosis, chronic exertional chest pain. Normal Myoview in 2017. She was last seen by Dr. Gwenlyn Found on 04/05/2018 and was stable. No changes were made to her regimen.   She was visiting her family in [place] over the Christmas holiday and experience blurred vision, double vision and syncope while sitting on the couch. Family states she was out for approximately 10 minutes. They put ice on her head and noticed she has some generalized weakness. She saw her PCP the following day who felt she may have had a TIA, but sincie she was feeling better, she was to follow up with Cardiology. She continues to have some disequilibrium when she stands up, feeling as if she is on a rocking boat. She denies further episodes of syncope or near syncope.  She has had recent labs in November which she brings with her. There was no evidence of anemia, infection, Creatinine 1.1, K 4.3. LDL 96. TC 175. She has not taken her antihypertensives today and is found to have elevated BP.  Past Medical History:  Diagnosis Date  . Chest pain on exertion   . Hyperlipidemia   . Hypertension     No past surgical history on file.   Current Outpatient Medications  Medication Sig Dispense Refill  . amLODipine (NORVASC) 5 MG tablet Take 5 mg by mouth daily. Take 1 tab by mouth daily  3  . aspirin EC 81 MG tablet Take 81 mg by mouth daily.    . metoprolol succinate (TOPROL-XL) 50 MG 24 hr tablet Take 1 tablet by mouth daily. Take 1 tab by mouth daily    . nitroGLYCERIN (NITROSTAT) 0.4 MG SL tablet Place 1  tablet under the tongue as needed. Place 1 tab under tongue if needed for chest pain  Repeat every 5 minutes up to 3 doses  1  . pantoprazole (PROTONIX) 40 MG tablet Take 1 tablet by mouth daily. Take 1 tab by mouth daily    . SYNTHROID 75 MCG tablet Take 1 tablet by mouth daily. Take 1 tab by mouth daily    . vitamin C (ASCORBIC ACID) 500 MG tablet Take 500 mg by mouth daily.    Marland Kitchen atorvastatin (LIPITOR) 10 MG tablet Take 1 tablet (10 mg total) by mouth daily at 6 PM. 30 tablet 3  . diphenhydrAMINE (BENADRYL) 50 MG tablet Take 1 tablet (50 mg total) by mouth at bedtime as needed for itching. 2HR PRIOR TO CT 30 tablet 0  . predniSONE (DELTASONE) 50 MG tablet 1TAB 13 HR PRIOR TO CT, 1TAB-7 HR PRIOR, 1TAB 2 HR PRIOR 3 tablet 0   No current facility-administered medications for this visit.     Allergies:   Sulfa antibiotics; Altace [ramipril]; Lisinopril; and Shellfish allergy    Social History:  The patient  reports that she quit smoking about 27 years ago. She has never used smokeless tobacco.   Family History:  The patient's family history is not on file.    ROS: All other systems are reviewed and negative.  Unless otherwise mentioned in H&P    PHYSICAL EXAM: VS:  BP (!) 178/79 (BP Location: Left Arm)   Pulse 73   Ht 5\' 3"  (1.6 m)   Wt 152 lb 6.4 oz (69.1 kg)   BMI 27.00 kg/m  , BMI Body mass index is 27 kg/m. GEN: Well nourished, well developed, in no acute distress HEENT: normal Neck: no JVD, bilateral carotid bruits, or masses Cardiac: RRR; no murmurs, rubs, or gallops,no edema  Respiratory:  Clear to auscultation bilaterally, normal work of breathing GI: soft, nontender, nondistended, + BS MS: no deformity or atrophy Skin: warm and dry, no rash Neuro:  Strength and sensation are intact Psych: euthymic mood, full affect   EKG: Not completed this office visit.   Recent Labs: No results found for requested labs within last 8760 hours.    Lipid Panel No results found  for: CHOL, TRIG, HDL, CHOLHDL, VLDL, LDLCALC, LDLDIRECT    Wt Readings from Last 3 Encounters:  11/24/18 152 lb 6.4 oz (69.1 kg)  04/05/18 149 lb (67.6 kg)  03/26/17 146 lb (66.2 kg)    Other studies Reviewed: Carotid Doppler Ultrasound 2014 Bilateral ICA demonstrated normal patency without evidence of significant diameter reduction Left ECA Demonstrated narrowing with increased velocities consistent with greater than 70% diameter reduction.  ASSESSMENT AND PLAN:  1. Questionable TIA: No current focal deficits. She has syncopal episode with 10 minute loss of consciousness and transient neuro deficit without residual deficits at this time. She continues to have some disequilibrium. I will have a CT scan of the brain to evaluate for abnormalities. She has shellfish allergy will be given prophylaxis.    2. Hypertension: BP is elevated today. She has not taken her medications yet. She  Is given water here in the clinic so that she can take it now.   3.  Carotid artery disease: ECA stenosis per last doppler study. Bilateral bruits now. As she has not had doppler studies since 2014, and symptoms of syncope, will repeat this.   4. Hypercholesterolemia: LDL 96.  Will place her on low dose Lipitor 10 mg daily.   Current medicines are reviewed at length with the patient today.    Labs/ tests ordered today include: CT of the brain with and without contrast, bilateral carotid doppler studies.   Phill Myron. West Pugh, ANP, [facility]   11/24/2018 8:38 AM    [facility] [address] Office [phone] Fax [phone]

## 2018-11-24 ENCOUNTER — Encounter: Payer: Self-pay | Admitting: Adult Health

## 2018-11-24 ENCOUNTER — Ambulatory Visit (INDEPENDENT_AMBULATORY_CARE_PROVIDER_SITE_OTHER): Payer: Medicare Other | Admitting: Adult Health

## 2018-11-24 VITALS — BP 178/79 | HR 73 | Ht 63.0 in | Wt 152.4 lb

## 2018-11-24 DIAGNOSIS — E7800 Pure hypercholesterolemia, unspecified: Secondary | ICD-10-CM | POA: Diagnosis not present

## 2018-11-24 DIAGNOSIS — G459 Transient cerebral ischemic attack, unspecified: Secondary | ICD-10-CM | POA: Diagnosis not present

## 2018-11-24 DIAGNOSIS — I10 Essential (primary) hypertension: Secondary | ICD-10-CM | POA: Diagnosis not present

## 2018-11-24 DIAGNOSIS — R0989 Other specified symptoms and signs involving the circulatory and respiratory systems: Secondary | ICD-10-CM | POA: Diagnosis not present

## 2018-11-24 LAB — BASIC METABOLIC PANEL
BUN/Creatinine Ratio: 21 (ref 12–28)
BUN: 24 mg/dL (ref 8–27)
CO2: 24 mmol/L (ref 20–29)
Calcium: 9.9 mg/dL (ref 8.7–10.3)
Chloride: 101 mmol/L (ref 96–106)
Creatinine, Ser: 1.12 mg/dL — ABNORMAL HIGH (ref 0.57–1.00)
GFR calc non Af Amer: 48 mL/min/{1.73_m2} — ABNORMAL LOW (ref >59)
GFR, EST AFRICAN AMERICAN: 55 mL/min/{1.73_m2} — AB (ref >59)
Glucose: 88 mg/dL (ref 65–99)
Potassium: 4.8 mmol/L (ref 3.5–5.2)
Sodium: 141 mmol/L (ref 134–144)

## 2018-11-24 MED ORDER — ATORVASTATIN CALCIUM 10 MG PO TABS: 10.0000 mg | ORAL_TABLET | Freq: Every day | ORAL | 3 refills | Status: DC

## 2018-11-24 MED ORDER — DIPHENHYDRAMINE HCL 50 MG PO TABS: 50.0000 mg | ORAL_TABLET | Freq: Every evening | ORAL | 0 refills | Status: DC | PRN

## 2018-11-24 MED ORDER — PREDNISONE 50 MG PO TABS: ORAL_TABLET | ORAL | 0 refills | Status: DC

## 2018-11-24 NOTE — Patient Instructions (Signed)
Medication Instructions:  START LIPITOR 10MG  AT NIGHT  If you need a refill on your cardiac medications before your next appointment, please call your pharmacy.  Labwork: BMET TODAY HERE IN OUR OFFICE AT [facility]  You will need to fast. DO NOT EAT OR DRINK PAST MIDNIGHT.     You will NOT need to fast   Take the provided lab slips with you to the lab for your blood draw.  When you have your labs (blood work) drawn today and your tests are completely normal, you will receive your results only by MyChart Message (if you have MyChart) -OR-  A paper copy in the mail.  If you have any lab test that is abnormal or we need to change your treatment, we will call you to review these results.  Testing/Procedures: CT W/CONTRAST-BRAIN, (CAT scanning) TOMORROW, is a noninvasive, special x-ray that produces cross-sectional images of the body using x-rays and a computer. CT scans help physicians diagnose and treat medical conditions. For some CT exams, a contrast material is used to enhance visibility in the area of the body being studied. CT scans provide greater clarity and reveal more details than regular x-ray exams.  Your physician has requested that you have a carotid duplex. This test is an ultrasound of the carotid arteries in your neck. It looks at blood flow through these arteries that supply the brain with blood. Allow one hour for this exam. There are no restrictions or special instructions.  Special Instructions: SHELLFISH ALLERGY PRECAUTIONS 1-PREDNISONE 13 HRS PRIOR TO CT, 1TAB PREDNISONE-7 HRS PRIOR TO CT, 1TAB PREDNISONE AND BENEDRYL 2 HRS PRIOR TO CT  Follow-Up: You will need a follow up appointment in [place].  You may see Quay Burow, MD  or one of the following Advanced Practice Providers on your designated Care Team:   Kerin Ransom, PA-C  Roby Lofts, PA-C  Sande Rives, [place]  At [facility], you and your health needs are our priority.  As part of our continuing mission to  provide you with exceptional heart care, we have created designated Provider Care Teams.  These Care Teams include your primary Cardiologist (physician) and Advanced Practice Providers (APPs -  Physician Assistants and Nurse Practitioners) who all work together to provide you with the care you need, when you need it.  Thank you for choosing [facility] at [facility]!!

## 2018-11-25 ENCOUNTER — Ambulatory Visit (HOSPITAL_COMMUNITY)
Admission: RE | Admit: 2018-11-25 | Discharge: 2018-11-25 | Disposition: A | Discharge status: Home / Self Care | Payer: Medicare Other | Source: Ambulatory Visit | Attending: Adult Health | Admitting: Adult Health

## 2018-11-25 ENCOUNTER — Telehealth: Payer: Self-pay | Admitting: Adult Health

## 2018-11-25 DIAGNOSIS — G459 Transient cerebral ischemic attack, unspecified: Secondary | ICD-10-CM | POA: Diagnosis not present

## 2018-11-25 MED ORDER — IOHEXOL 300 MG/ML  SOLN
100.0000 mL | Freq: Once | INTRAMUSCULAR | Status: AC | PRN
  Administered 2018-11-25: 100 mL via INTRAVENOUS

## 2018-11-25 NOTE — Telephone Encounter (Signed)
New Message    Pt is having a CT and [place] called having questions about the order Please call

## 2018-11-25 NOTE — Telephone Encounter (Signed)
Returned call to [place] at [place].Stated she was calling to clarify ct of brain order.Stated already taken care of.Ct already done.

## 2018-11-28 ENCOUNTER — Ambulatory Visit (HOSPITAL_COMMUNITY)
Admission: RE | Admit: 2018-11-28 | Discharge: 2018-11-28 | Disposition: A | Discharge status: Home / Self Care | Payer: Medicare Other | Source: Ambulatory Visit | Attending: Internal Medicine | Admitting: Internal Medicine

## 2018-11-28 ENCOUNTER — Encounter (INDEPENDENT_AMBULATORY_CARE_PROVIDER_SITE_OTHER): Payer: Self-pay

## 2018-11-28 DIAGNOSIS — R0989 Other specified symptoms and signs involving the circulatory and respiratory systems: Secondary | ICD-10-CM

## 2018-11-28 DIAGNOSIS — G459 Transient cerebral ischemic attack, unspecified: Secondary | ICD-10-CM | POA: Diagnosis not present

## 2018-12-09 ENCOUNTER — Encounter: Payer: Self-pay | Admitting: Cardiovascular Disease

## 2018-12-09 ENCOUNTER — Ambulatory Visit (INDEPENDENT_AMBULATORY_CARE_PROVIDER_SITE_OTHER): Payer: Medicare Other | Admitting: Cardiovascular Disease

## 2018-12-09 VITALS — BP 148/62 | HR 64 | Ht 63.0 in | Wt 151.2 lb

## 2018-12-09 DIAGNOSIS — E7800 Pure hypercholesterolemia, unspecified: Secondary | ICD-10-CM | POA: Diagnosis not present

## 2018-12-09 DIAGNOSIS — R0989 Other specified symptoms and signs involving the circulatory and respiratory systems: Secondary | ICD-10-CM

## 2018-12-09 DIAGNOSIS — I10 Essential (primary) hypertension: Secondary | ICD-10-CM | POA: Diagnosis not present

## 2018-12-09 DIAGNOSIS — G459 Transient cerebral ischemic attack, unspecified: Secondary | ICD-10-CM | POA: Diagnosis not present

## 2018-12-09 NOTE — Assessment & Plan Note (Signed)
Elizabeth Brown recently had an episode which sounded like a TIA.  She had transient numbness of her right arm after loss of consciousness.  Head CT was negative and carotid Dopplers were unchanged.  I am going to get a 30-day event monitor to further evaluate.

## 2018-12-09 NOTE — Patient Instructions (Signed)
Medication Instructions:  none If you need a refill on your cardiac medications before your next appointment, please call your pharmacy.   Lab work: none If you have labs (blood work) drawn today and your tests are completely normal, you will receive your results only by: Marland Kitchen MyChart Message (if you have MyChart) OR . A paper copy in the mail If you have any lab test that is abnormal or we need to change your treatment, we will call you to review the results.  Testing/Procedures: Your physician has recommended that you wear a 30-day event monitor. Event monitors are medical devices that record the heart's electrical activity. Doctors most often [place] these monitors to diagnose arrhythmias. Arrhythmias are problems with the speed or rhythm of the heartbeat. The monitor is a small, portable device. You can wear one while you do your normal daily activities. This is usually used to diagnose what is causing palpitations/syncope (passing out).    Follow-Up: At [facility], you and your health needs are our priority.  As part of our continuing mission to provide you with exceptional heart care, we have created designated Provider Care Teams.  These Care Teams include your primary Cardiologist (physician) and Advanced Practice Providers (APPs -  Physician Assistants and Nurse Practitioners) who all work together to provide you with the care you need, when you need it. . You will need a follow up appointment in 6 months with Jory Sims, DNP and 12 months with Dr. Gwenlyn Found.  Please call our office 2 months in advance to schedule each appointment.  You may see Dr. Gwenlyn Found or one of the following Advanced Practice Providers on your designated Care Team:   . Kerin Ransom, [place] . Almyra Deforest, PA-C . Fabian Sharp, PA-C . Jory Sims, DNP . Rosaria Ferries, PA-C . Roby Lofts, PA-C . Sande Rives, PA-C

## 2018-12-09 NOTE — Assessment & Plan Note (Signed)
History of essential hypertension with blood pressure measured at 148/62.  She is on amlodipine and metoprolol.  Her blood pressures at home are much better controlled.

## 2018-12-09 NOTE — Assessment & Plan Note (Signed)
Her most recent lipid profile performed 09/26/2018 revealed total cholesterol 175, LDL 96 and HDL 41.  I do not see an indication at this point for her to be on statins.

## 2018-12-09 NOTE — Progress Notes (Signed)
12/09/2018 Elizabeth Brown   19-Aug-1941  [id number]  Primary Physician Street, Sharon Mt, MD Primary Cardiologist: Lorretta Harp MD Lupe Carney, [place]  HPI:  Elizabeth Brown is a 78 y.o.  married Caucasian female with 2 children whose husband [name] is also a patient of mine. I last saw herin the office  04/05/2018.   She Has a history of treated hypertension. She has mild hyperlipidemia not on statin therapy. Her father did die of a myocardial infarction at age 40. She has never had a heart attack or stroke. She had a negative Myoview stress test in our office 01/26/13 as well as a normal 2-D echocardiogram. Carotid Dopplers performed at the same time showed high-grade left external carotid artery stenosis done because of an auscultated bruit. She's had new onset exertional chest pain approximately one month ago occurring several times a week reproducible with mild to moderate exertion. The discomfort radiatedinto her left shoulder arm back and neck.I performed Myoview stress testing 12/26/15 which was entirely normal.   Since I saw her 6 months ago she has had a TIA sounding event and saw Jory Sims in the office.  Carotid Dopplers were unchanged and head CT was negative.  Current Meds  Medication Sig  . amLODipine (NORVASC) 5 MG tablet Take 5 mg by mouth daily. Take 1 tab by mouth daily  . aspirin EC 81 MG tablet Take 81 mg by mouth daily.  Marland Kitchen atorvastatin (LIPITOR) 10 MG tablet Take 1 tablet (10 mg total) by mouth daily at 6 PM.  . metoprolol succinate (TOPROL-XL) 50 MG 24 hr tablet Take 1 tablet by mouth daily. Take 1 tab by mouth daily  . nitroGLYCERIN (NITROSTAT) 0.4 MG SL tablet Place 1 tablet under the tongue as needed. Place 1 tab under tongue if needed for chest pain  Repeat every 5 minutes up to 3 doses  . pantoprazole (PROTONIX) 40 MG tablet Take 1 tablet by mouth daily. Take 1 tab by mouth daily  . SYNTHROID 75 MCG tablet Take 1 tablet by mouth daily. Take 1 tab by mouth  daily  . vitamin C (ASCORBIC ACID) 500 MG tablet Take 500 mg by mouth daily.  . [DISCONTINUED] diphenhydrAMINE (BENADRYL) 50 MG tablet Take 1 tablet (50 mg total) by mouth at bedtime as needed for itching. 2HR PRIOR TO CT  . [DISCONTINUED] predniSONE (DELTASONE) 50 MG tablet 1TAB 13 HR PRIOR TO CT, 1TAB-7 HR PRIOR, 1TAB 2 HR PRIOR     Allergies  Allergen Reactions  . Sulfa Antibiotics Hives  . Altace [Ramipril] Other (See Comments) and Cough  . Lisinopril Rash  . Shellfish Allergy Hives    Social History   Socioeconomic History  . Marital status: Unknown    Spouse name: Not on file  . Number of children: Not on file  . Years of education: Not on file  . Highest education level: Not on file  Occupational History  . Not on file  Social Needs  . Financial resource strain: Not on file  . Food insecurity:    Worry: Not on file    Inability: Not on file  . Transportation needs:    Medical: Not on file    Non-medical: Not on file  Tobacco Use  . Smoking status: Former Smoker    Last attempt to quit: 12/19/1990    Years since quitting: 27.9  . Smokeless tobacco: Never Used  Substance and Sexual Activity  . Alcohol use: Not on file  .  Drug use: Not on file  . Sexual activity: Not on file  Lifestyle  . Physical activity:    Days per week: Not on file    Minutes per session: Not on file  . Stress: Not on file  Relationships  . Social connections:    Talks on phone: Not on file    Gets together: Not on file    Attends religious service: Not on file    Active member of club or organization: Not on file    Attends meetings of clubs or organizations: Not on file    Relationship status: Not on file  . Intimate partner violence:    Fear of current or ex partner: Not on file    Emotionally abused: Not on file    Physically abused: Not on file    Forced sexual activity: Not on file  Other Topics Concern  . Not on file  Social History Narrative  . Not on file     Review of  Systems: General: negative for chills, fever, night sweats or weight changes.  Cardiovascular: negative for chest pain, dyspnea on exertion, edema, orthopnea, palpitations, paroxysmal nocturnal dyspnea or shortness of breath Dermatological: negative for rash Respiratory: negative for cough or wheezing Urologic: negative for hematuria Abdominal: negative for nausea, vomiting, diarrhea, bright red blood per rectum, melena, or hematemesis Neurologic: negative for visual changes, syncope, or dizziness All other systems reviewed and are otherwise negative except as noted above.    Blood pressure (!) 148/62, pulse 64, height 5\' 3"  (1.6 m), weight 151 lb 3.2 oz (68.6 kg).  General appearance: alert and no distress Neck: no adenopathy, no JVD, supple, symmetrical, trachea midline, thyroid not enlarged, symmetric, no tenderness/mass/nodules and Soft left carotid bruit Lungs: clear to auscultation bilaterally Heart: regular rate and rhythm, S1, S2 normal, no murmur, click, rub or gallop Extremities: extremities normal, atraumatic, no cyanosis or edema Pulses: 2+ and symmetric Skin: Skin color, texture, turgor normal. No rashes or lesions Neurologic: Alert and oriented X 3, normal strength and tone. Normal symmetric reflexes. Normal coordination and gait  EKG sinus rhythm at 64 without ST or T wave changes.  I personally reviewed this EKG. ASSESSMENT AND PLAN:   Essential hypertension History of essential hypertension with blood pressure measured at 148/62.  She is on amlodipine and metoprolol.  Her blood pressures at home are much better controlled.  Hyperlipidemia Her most recent lipid profile performed 09/26/2018 revealed total cholesterol 175, LDL 96 and HDL 41.  I do not see an indication at this point for her to be on statins.  Left carotid bruit Left carotid bruit most likely related from her left external carotid artery stenosis.  Recent carotid Doppler showed no evidence of ICA  stenosis.  TIA (transient ischemic attack) Ms. Lippold recently had an episode which sounded like a TIA.  She had transient numbness of her right arm after loss of consciousness.  Head CT was negative and carotid Dopplers were unchanged.  I am going to get a 30-day event monitor to further evaluate.      Lorretta Harp MD FACP,FACC,FAHA, [facility] 12/09/2018 11:27 AM

## 2018-12-09 NOTE — Assessment & Plan Note (Signed)
Left carotid bruit most likely related from her left external carotid artery stenosis.  Recent carotid Doppler showed no evidence of ICA stenosis.

## 2018-12-16 ENCOUNTER — Other Ambulatory Visit: Payer: Self-pay | Admitting: Cardiovascular Disease

## 2018-12-16 ENCOUNTER — Ambulatory Visit (INDEPENDENT_AMBULATORY_CARE_PROVIDER_SITE_OTHER): Payer: Medicare Other

## 2018-12-16 DIAGNOSIS — I4891 Unspecified atrial fibrillation: Secondary | ICD-10-CM

## 2018-12-16 DIAGNOSIS — G459 Transient cerebral ischemic attack, unspecified: Secondary | ICD-10-CM

## 2019-02-18 ENCOUNTER — Other Ambulatory Visit: Payer: Self-pay | Admitting: Adult Health

## 2019-06-11 NOTE — Progress Notes (Signed)
Cardiology Office Note   Date:  06/12/2019   ID:  Gerard Bonus, DOB January 05, 1941, MRN [id number]  PCP:  Street, Sharon Mt, MD  Cardiologist: Dr. Gwenlyn Found  No chief complaint on file.    History of Present Illness: Elizabeth Brown is a 78 y.o. female who presents for ongoing assessment and management of hypertension, hyperlipidemia, negative Myoview stress test in 2014 and normal echocardiogram at the same time.  She was noted to have abnormal carotid Doppler studies which revealed high-grade left external carotid stenosis in 2014.  She also complained of left shoulder arm back and neck pain.  A repeat nuclear medicine stress test was negative for new areas of ischemia.  The patient had an episode of what appeared to be a TIA in June 2016 with follow-up carotid Dopplers and CT scan negative.  At the time of the last office visit on 12/09/2018 the patient was stable.  Blood pressures were slightly elevated in the office but reportedly much better with blood pressures at home.  Review of lipid studies were completed and she was not indicated for statin therapy.  A 30-day cardiac event monitor was placed on the patient due to her symptoms of TIA.  Cardiac monitor dated 12/16/2018 revealed sinus rhythm/sinus bradycardia no evidence of atrial fibrillation, pauses, or ventricular arrhythmias.  She and her husband are currently living in [place] in the mountains to escape the summer heat.  They often hike each day and she notices that she is becoming more short of breath with the inclines having to stop sometimes.  She denies any chest pressure.  No dizziness, no palpitations.  Past Medical History:  Diagnosis Date  . Chest pain on exertion   . Hyperlipidemia   . Hypertension     No past surgical history on file.   Current Outpatient Medications  Medication Sig Dispense Refill  . amLODipine (NORVASC) 5 MG tablet Take 1 tablet (5 mg total) by mouth daily. Take 1 tab by mouth daily 90 tablet 2  . aspirin EC  81 MG tablet Take 81 mg by mouth daily.    Marland Kitchen atorvastatin (LIPITOR) 10 MG tablet Take 1 tablet (10 mg total) by mouth daily at 6 PM. 90 tablet 2  . metoprolol succinate (TOPROL-XL) 50 MG 24 hr tablet Take 1 tablet by mouth daily. Take 1 tab by mouth daily    . nitroGLYCERIN (NITROSTAT) 0.4 MG SL tablet Place 1 tablet under the tongue as needed. Place 1 tab under tongue if needed for chest pain  Repeat every 5 minutes up to 3 doses  1  . pantoprazole (PROTONIX) 40 MG tablet Take 1 tablet by mouth daily. Take 1 tab by mouth daily    . SYNTHROID 75 MCG tablet Take 1 tablet (75 mcg total) by mouth daily. Take 1 tab by mouth daily 90 tablet 1  . vitamin C (ASCORBIC ACID) 500 MG tablet Take 500 mg by mouth daily.     No current facility-administered medications for this visit.     Allergies:   Sulfa antibiotics, Altace [ramipril], Lisinopril, and Shellfish allergy    Social History:  The patient  reports that she quit smoking about 28 years ago. She has never used smokeless tobacco.   Family History:  The patient's family history is not on file.    ROS: All other systems are reviewed and negative. Unless otherwise mentioned in H&P    PHYSICAL EXAM: VS:  BP (!) 157/78   Pulse 76   Temp (!) 96.8  F (36 C)   Ht 5' 2.5" (1.588 m)   Wt 148 lb 9.6 oz (67.4 kg)   BMI 26.75 kg/m  , BMI Body mass index is 26.75 kg/m. GEN: Well nourished, well developed, in no acute distress HEENT: normal Neck: no JVD, left carotid bruits, or masses Cardiac: RRR; 2/6 systolic murmurs, rubs, or gallops,no edema  Respiratory:  Clear to auscultation bilaterally, normal work of breathing GI: soft, nontender, nondistended, + BS MS: no deformity or atrophy Skin: warm and dry, no rash Neuro:  Strength and sensation are intact Psych: euthymic mood, full affect   EKG: Normal sinus rhythm heart rate of 67 bpm, anterior abnormality with T wave flattening in V2. Recent Labs: 11/24/2018: BUN 24; Creatinine, Ser 1.12;  Potassium 4.8; Sodium 141    Lipid Panel No results found for: CHOL, TRIG, HDL, CHOLHDL, VLDL, LDLCALC, LDLDIRECT    Wt Readings from Last 3 Encounters:  06/12/19 148 lb 9.6 oz (67.4 kg)  12/09/18 151 lb 3.2 oz (68.6 kg)  11/24/18 152 lb 6.4 oz (69.1 kg)      Other studies Reviewed: Carotid Doppler Studies 11/28/2018  Carotid Arterial Duplex Study  Indications: Questionable TIA-- per chart: over the christmas holiday, patient              had syncopal episode with 10 minute loss of consciousness and              transient neuro deficit without residual deficits at this time.  Performing Technologist: Chesley Noon RVT  NM Stress Test 12/26/2015   The left ventricular ejection fraction is hyperdynamic (>65%).  Nuclear stress EF: 80%.  Blood pressure demonstrated a hypertensive response to exercise. There was 86mm of horizontal to upsloping ST segment depression in the inferolateral leads at peak exercise. In recovery there was downlsoping ST segments in the inferolateral leads. Patient had Chest pain and SOB during the study.  This is a low risk study.  No ischemia noted on perfusion imaging.    ASSESSMENT AND PLAN:  1.  Hypertension: Blood pressure has normalized.  She brings with her her blood pressure recordings from home with blood pressures running around 138/78 pretty consistently.  She needs refills on amlodipine, as her primary care physician was providing this but she does not have an appointment until November.  I will provide refills with 90-day supply.  She will continue on metoprolol..  She will follow-up with Dr. Gwenlyn Found in January.   2.  Thyroid disease: Remains on Synthroid 75 mcg daily.  As she is not able to see her primary care until November, I will refill her Synthroid today and she will have follow-up refills with her primary care during that appointment.  3.  Hypercholesterolemia: Continue atorvastatin 10 mg daily.  All of her follow-up labs are going to  be completed by PCP as they are done with each visit.  Will defer to PCP and review labs once they are available.  4.  History of TIA: She remains on aspirin 81 mg daily.  Most recent carotid Doppler studies completed on 11/28/2018 did not reveal any high velocities in the right ICA or left ICA.  The ECA appeared greater than 50% stenosed.  5.  Aortic valve murmur: She is not had any echocardiogram completed since 2014.  I will repeat her echocardiogram for evaluation of her heart valves and LV function.  Current medicines are reviewed at length with the patient today.    Labs/ tests ordered today include: None  Phill Myron. West Pugh, ANP, AACC   06/12/2019 5:17 PM    [address] Office [phone] Fax [phone]

## 2019-06-12 ENCOUNTER — Other Ambulatory Visit: Payer: Self-pay

## 2019-06-12 ENCOUNTER — Ambulatory Visit (INDEPENDENT_AMBULATORY_CARE_PROVIDER_SITE_OTHER): Payer: Medicare Other | Admitting: Adult Health

## 2019-06-12 ENCOUNTER — Encounter: Payer: Self-pay | Admitting: Adult Health

## 2019-06-12 VITALS — BP 157/78 | HR 76 | Temp 96.8°F | Ht 62.5 in | Wt 148.6 lb

## 2019-06-12 DIAGNOSIS — E079 Disorder of thyroid, unspecified: Secondary | ICD-10-CM

## 2019-06-12 DIAGNOSIS — R0989 Other specified symptoms and signs involving the circulatory and respiratory systems: Secondary | ICD-10-CM

## 2019-06-12 DIAGNOSIS — I10 Essential (primary) hypertension: Secondary | ICD-10-CM | POA: Diagnosis not present

## 2019-06-12 DIAGNOSIS — I350 Nonrheumatic aortic (valve) stenosis: Secondary | ICD-10-CM | POA: Diagnosis not present

## 2019-06-12 DIAGNOSIS — I358 Other nonrheumatic aortic valve disorders: Secondary | ICD-10-CM

## 2019-06-12 DIAGNOSIS — G459 Transient cerebral ischemic attack, unspecified: Secondary | ICD-10-CM | POA: Diagnosis not present

## 2019-06-12 DIAGNOSIS — E7800 Pure hypercholesterolemia, unspecified: Secondary | ICD-10-CM | POA: Diagnosis not present

## 2019-06-12 MED ORDER — AMLODIPINE BESYLATE 5 MG PO TABS: 5.0000 mg | ORAL_TABLET | Freq: Every day | ORAL | 2 refills | Status: AC

## 2019-06-12 MED ORDER — SYNTHROID 75 MCG PO TABS: 75.0000 ug | ORAL_TABLET | Freq: Every day | ORAL | 1 refills | Status: DC

## 2019-06-12 NOTE — Patient Instructions (Signed)
Medication Instructions:  Continue current medications  If you need a refill on your cardiac medications before your next appointment, please call your pharmacy.  Labwork: None Ordered   Testing/Procedures: Your physician has requested that you have an echocardiogram. Echocardiography is a painless test that uses sound waves to create images of your heart. It provides your doctor with information about the size and shape of your heart and how well your heart's chambers and valves are working. This procedure takes approximately one hour. There are no restrictions for this procedure.  Follow-Up: You will need a follow up appointment in 6 months.  Please call our office 2 months in advance to schedule this appointment.  You may see Quay Burow, MD or one of the following Advanced Practice Providers on your designated Care Team:   Kerin Ransom, PA-C Roby Lofts, [place] . Sande Rives, PA-C     At [facility], you and your health needs are our priority.  As part of our continuing mission to provide you with exceptional heart care, we have created designated Provider Care Teams.  These Care Teams include your primary Cardiologist (physician) and Advanced Practice Providers (APPs -  Physician Assistants and Nurse Practitioners) who all work together to provide you with the care you need, when you need it.  Thank you for choosing [facility] at [facility]!!

## 2019-07-05 ENCOUNTER — Ambulatory Visit (HOSPITAL_COMMUNITY): Payer: Medicare Other | Attending: Cardiovascular Disease

## 2019-07-05 ENCOUNTER — Other Ambulatory Visit: Payer: Self-pay

## 2019-07-05 DIAGNOSIS — I350 Nonrheumatic aortic (valve) stenosis: Secondary | ICD-10-CM

## 2019-08-08 DIAGNOSIS — Z23 Encounter for immunization: Secondary | ICD-10-CM | POA: Diagnosis not present

## 2019-10-02 DIAGNOSIS — E039 Hypothyroidism, unspecified: Secondary | ICD-10-CM | POA: Diagnosis not present

## 2019-10-02 DIAGNOSIS — E559 Vitamin D deficiency, unspecified: Secondary | ICD-10-CM | POA: Diagnosis not present

## 2019-10-02 DIAGNOSIS — Z79899 Other long term (current) drug therapy: Secondary | ICD-10-CM | POA: Diagnosis not present

## 2019-10-02 DIAGNOSIS — R739 Hyperglycemia, unspecified: Secondary | ICD-10-CM | POA: Diagnosis not present

## 2019-10-02 DIAGNOSIS — E782 Mixed hyperlipidemia: Secondary | ICD-10-CM | POA: Diagnosis not present

## 2019-10-04 ENCOUNTER — Other Ambulatory Visit: Payer: Self-pay

## 2019-10-10 DIAGNOSIS — Z0000 Encounter for general adult medical examination without abnormal findings: Secondary | ICD-10-CM | POA: Diagnosis not present

## 2019-10-10 DIAGNOSIS — I10 Essential (primary) hypertension: Secondary | ICD-10-CM | POA: Diagnosis not present

## 2019-10-10 DIAGNOSIS — E782 Mixed hyperlipidemia: Secondary | ICD-10-CM | POA: Diagnosis not present

## 2019-10-10 DIAGNOSIS — I6522 Occlusion and stenosis of left carotid artery: Secondary | ICD-10-CM | POA: Diagnosis not present

## 2019-10-10 DIAGNOSIS — E039 Hypothyroidism, unspecified: Secondary | ICD-10-CM | POA: Diagnosis not present

## 2019-11-22 ENCOUNTER — Other Ambulatory Visit: Payer: Self-pay | Admitting: Cardiovascular Disease

## 2020-01-01 ENCOUNTER — Other Ambulatory Visit: Payer: Self-pay | Admitting: *Deleted

## 2020-01-01 MED ORDER — SYNTHROID 75 MCG PO TABS: 75.0000 ug | ORAL_TABLET | Freq: Every day | ORAL | 1 refills | Status: AC

## 2020-01-01 NOTE — Telephone Encounter (Signed)
Rx has been sent to the pharmacy electronically. ° °

## 2020-01-16 ENCOUNTER — Other Ambulatory Visit: Payer: Self-pay | Admitting: Cardiovascular Disease

## 2020-01-16 ENCOUNTER — Encounter: Payer: Self-pay | Admitting: Cardiovascular Disease

## 2020-01-16 ENCOUNTER — Ambulatory Visit (INDEPENDENT_AMBULATORY_CARE_PROVIDER_SITE_OTHER): Payer: Medicare Other | Admitting: Cardiovascular Disease

## 2020-01-16 ENCOUNTER — Other Ambulatory Visit: Payer: Self-pay

## 2020-01-16 VITALS — BP 138/68 | HR 70 | Temp 97.7°F | Resp 21 | Ht 62.5 in | Wt 150.8 lb

## 2020-01-16 DIAGNOSIS — I350 Nonrheumatic aortic (valve) stenosis: Secondary | ICD-10-CM | POA: Diagnosis not present

## 2020-01-16 DIAGNOSIS — I10 Essential (primary) hypertension: Secondary | ICD-10-CM

## 2020-01-16 DIAGNOSIS — M79606 Pain in leg, unspecified: Secondary | ICD-10-CM

## 2020-01-16 DIAGNOSIS — E782 Mixed hyperlipidemia: Secondary | ICD-10-CM

## 2020-01-16 NOTE — Assessment & Plan Note (Signed)
History of essential hypertension with blood pressure measured today at 138/68.  She is on amlodipine and metoprolol.

## 2020-01-16 NOTE — Patient Instructions (Signed)
Medication Instructions:  Your physician recommends that you continue on your current medications as directed. Please refer to the Current Medication list given to you today.  If you need a refill on your cardiac medications before your next appointment, please call your pharmacy.   Lab work: NONE  Testing/Procedures: Your physician has requested that you have an ankle brachial index (ABI). During this test an ultrasound and blood pressure cuff are used to evaluate the arteries that supply the arms and legs with blood. Allow thirty minutes for this exam. There are no restrictions or special instructions.   Follow-Up: At [facility], you and your health needs are our priority.  As part of our continuing mission to provide you with exceptional heart care, we have created designated Provider Care Teams.  These Care Teams include your primary Cardiologist (physician) and Advanced Practice Providers (APPs -  Physician Assistants and Nurse Practitioners) who all work together to provide you with the care you need, when you need it. You may see Quay Burow, MD or one of the following Advanced Practice Providers on your designated Care Team:    Kerin Ransom, PA-C  [place], [place]  Coletta Memos, [place]  Your physician wants you to follow-up in: 1 year with Dr. Gwenlyn Found. You will receive a reminder letter in the mail two months in advance. If you don't receive a letter, please call our office to schedule the follow-up appointment.

## 2020-01-16 NOTE — Progress Notes (Signed)
01/16/2020 Elizabeth Brown   1941-06-16  [id number]  Primary Physician Street, [place] Mt, MD Primary Cardiologist: Lorretta Harp MD Lupe Carney, [place]  HPI:  Elizabeth Brown is a 80 y.o.    married Caucasian female with 2 children whose husband Elizabeth Brown is also a patient of mine. I last saw herin the office  12/09/2018.   She Has a history of treated hypertension. She has mild hyperlipidemia not on statin therapy. Her father did die of a myocardial infarction at age 81. She has never had a heart attack or stroke. She had a negative Myoview stress test in our office 01/26/13 as well as a normal 2-D echocardiogram. Carotid Dopplers performed at the same time showed high-grade left external carotid artery stenosis done because of an auscultated bruit. She's had new onset exertional chest pain approximately one month ago occurring several times a week reproducible with mild to moderate exertion. The discomfort radiatedinto her left shoulder arm back and neck.I performedMyoview stress testing 12/26/15 which was entirely normal.   Since I saw her in the office a year ago she continues to do well.  She has been vaccinated with a COVID-19 "vaccine".  She denies chest pain or shortness of breath.  She did have a 2D echo performed 07/05/2019 which was entirely normal.   Current Meds  Medication Sig  . amLODipine (NORVASC) 5 MG tablet Take 1 tablet (5 mg total) by mouth daily. Take 1 tab by mouth daily  . aspirin EC 81 MG tablet Take 81 mg by mouth daily.  Marland Kitchen atorvastatin (LIPITOR) 10 MG tablet Take 1 tablet (10 mg total) by mouth daily at 6 PM.  . gabapentin (NEURONTIN) 300 MG capsule Take 600 mg by mouth every 8 (eight) hours as needed.  . metoprolol succinate (TOPROL-XL) 50 MG 24 hr tablet Take 1 tablet by mouth daily. Take 1 tab by mouth daily  . nitroGLYCERIN (NITROSTAT) 0.4 MG SL tablet Place 1 tablet under the tongue as needed. Place 1 tab under tongue if needed for chest pain  Repeat every 5 minutes  up to 3 doses  . pantoprazole (PROTONIX) 40 MG tablet Take 1 tablet by mouth daily. Take 1 tab by mouth daily  . SYNTHROID 75 MCG tablet Take 1 tablet (75 mcg total) by mouth daily. Take 1 tab by mouth daily  . triamcinolone cream (KENALOG) 0.1 %   . vitamin C (ASCORBIC ACID) 500 MG tablet Take 500 mg by mouth daily.     Allergies  Allergen Reactions  . Sulfa Antibiotics Hives  . Altace [Ramipril] Other (See Comments) and Cough  . Lisinopril Rash  . Shellfish Allergy Hives    Social History   Socioeconomic History  . Marital status: Unknown    Spouse name: Not on file  . Number of children: Not on file  . Years of education: Not on file  . Highest education level: Not on file  Occupational History  . Not on file  Tobacco Use  . Smoking status: Former Smoker    Quit date: 12/19/1990    Years since quitting: 29.0  . Smokeless tobacco: Never Used  Substance and Sexual Activity  . Alcohol use: Not on file  . Drug use: Not on file  . Sexual activity: Not on file  Other Topics Concern  . Not on file  Social History Narrative  . Not on file   Social Determinants of Health   Financial Resource Strain:   . Difficulty of Paying  Living Expenses: Not on file  Food Insecurity:   . Worried About Charity fundraiser in the Last Year: Not on file  . Ran Out of Food in the Last Year: Not on file  Transportation Needs:   . Lack of Transportation (Medical): Not on file  . Lack of Transportation (Non-Medical): Not on file  Physical Activity:   . Days of Exercise per Week: Not on file  . Minutes of Exercise per Session: Not on file  Stress:   . Feeling of Stress : Not on file  Social Connections:   . Frequency of Communication with Friends and Family: Not on file  . Frequency of Social Gatherings with Friends and Family: Not on file  . Attends Religious Services: Not on file  . Active Member of Clubs or Organizations: Not on file  . Attends Archivist Meetings: Not on  file  . Marital Status: Not on file  Intimate Partner Violence:   . Fear of Current or Ex-Partner: Not on file  . Emotionally Abused: Not on file  . Physically Abused: Not on file  . Sexually Abused: Not on file     Review of Systems: General: negative for chills, fever, night sweats or weight changes.  Cardiovascular: negative for chest pain, dyspnea on exertion, edema, orthopnea, palpitations, paroxysmal nocturnal dyspnea or shortness of breath Dermatological: negative for rash Respiratory: negative for cough or wheezing Urologic: negative for hematuria Abdominal: negative for nausea, vomiting, diarrhea, bright red blood per rectum, melena, or hematemesis Neurologic: negative for visual changes, syncope, or dizziness All other systems reviewed and are otherwise negative except as noted above.    Blood pressure 138/68, pulse 70, temperature 97.7 F (36.5 C), resp. rate (!) 21, height 5' 2.5" (1.588 m), weight 150 lb 12.8 oz (68.4 kg), SpO2 98 %.  General appearance: alert and no distress Neck: no adenopathy, no carotid bruit, no JVD, supple, symmetrical, trachea midline and thyroid not enlarged, symmetric, no tenderness/mass/nodules Lungs: clear to auscultation bilaterally Heart: regular rate and rhythm, S1, S2 normal, no murmur, click, rub or gallop Extremities: extremities normal, atraumatic, no cyanosis or edema Pulses: 2+ and symmetric Skin: Skin color, texture, turgor normal. No rashes or lesions Neurologic: Alert and oriented X 3, normal strength and tone. Normal symmetric reflexes. Normal coordination and gait  EKG sinus rhythm at 63 without ST or T wave changes.  I personally reviewed this EKG.  ASSESSMENT AND PLAN:   Essential hypertension History of essential hypertension with blood pressure measured today at 138/68.  She is on amlodipine and metoprolol.  Hyperlipidemia History of hyperlipidemia on statin therapy with lipid profile performed by her PCP 10/02/2019  revealing total cholesterol 147, LDL of 77 and HDL [place] MD [facility], [facility] 01/16/2020 10:17 AM

## 2020-01-16 NOTE — Assessment & Plan Note (Signed)
History of hyperlipidemia on statin therapy with lipid profile performed by her PCP 10/02/2019 revealing total cholesterol 147, LDL of 77 and HDL 47

## 2020-01-19 ENCOUNTER — Ambulatory Visit: Payer: Medicare Other | Admitting: Cardiovascular Disease

## 2020-01-25 ENCOUNTER — Encounter (HOSPITAL_COMMUNITY): Payer: Medicare Other

## 2020-02-01 ENCOUNTER — Ambulatory Visit (HOSPITAL_COMMUNITY)
Admission: RE | Admit: 2020-02-01 | Discharge: 2020-02-01 | Disposition: A | Discharge status: Home / Self Care | Payer: Medicare Other | Source: Ambulatory Visit | Attending: Cardiovascular Disease | Admitting: Cardiovascular Disease

## 2020-02-01 ENCOUNTER — Other Ambulatory Visit: Payer: Self-pay

## 2020-02-01 DIAGNOSIS — M79606 Pain in leg, unspecified: Secondary | ICD-10-CM | POA: Insufficient documentation

## 2020-08-30 IMAGING — CT CT HEAD WO/W CM
3 of 4 series · 14 of 47 positions shown, 16 images · IV contrast (APPLIED)
Comparison: None.

CLINICAL DATA: Syncopal episode 1 month ago. Transient ischemic
attack.

EXAM:
CT HEAD WITHOUT AND WITH CONTRAST
TECHNIQUE: Contiguous axial images were obtained from the base of the skull
through the vertex without and with intravenous contrast
CONTRAST:  100mL OMNIPAQUE IOHEXOL 300 MG/ML  SOLN

[Series 3: head wo 5.0 h30s · axial · 0.40mm/px · z∈[-61,+74]mm · 8 of 33 slices shown, 10 images]
[im 3/33  brain]
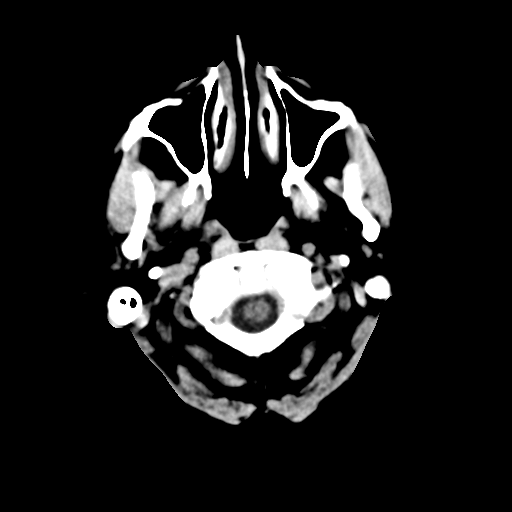
[im 3/33  bone]
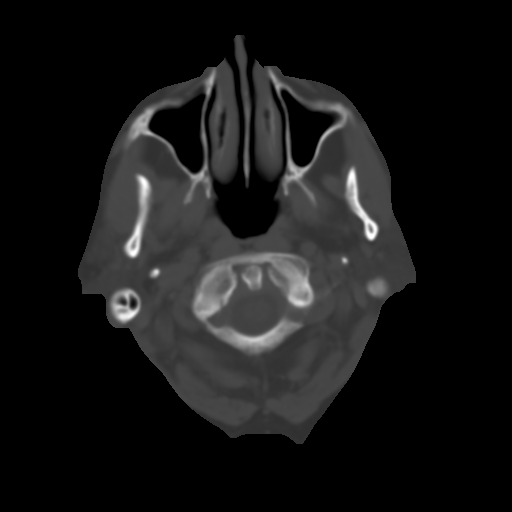
[im 7/33  brain]
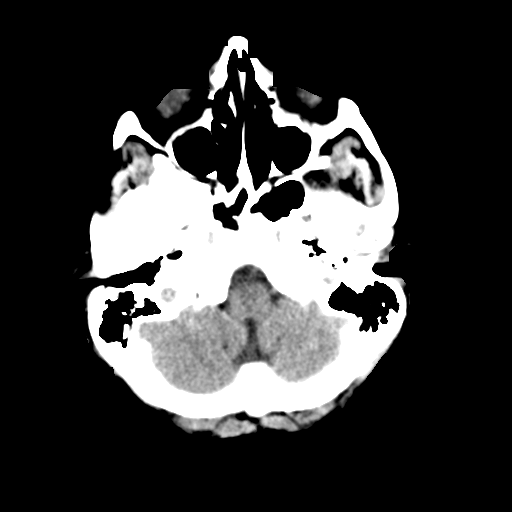
[im 12/33  brain]
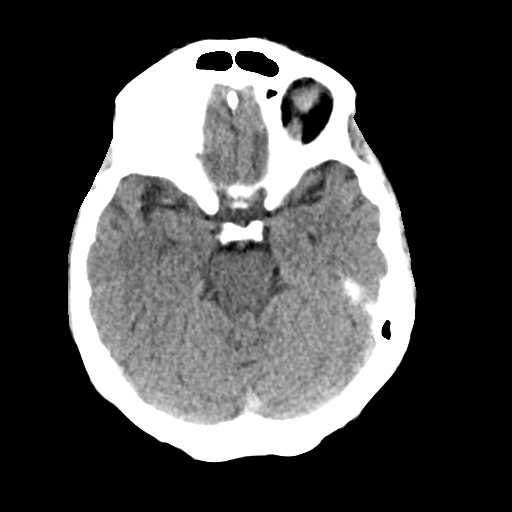
[im 14/33  brain]
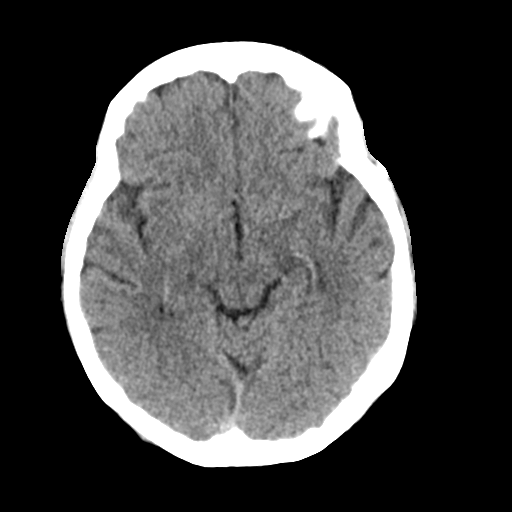
[im 19/33  brain]
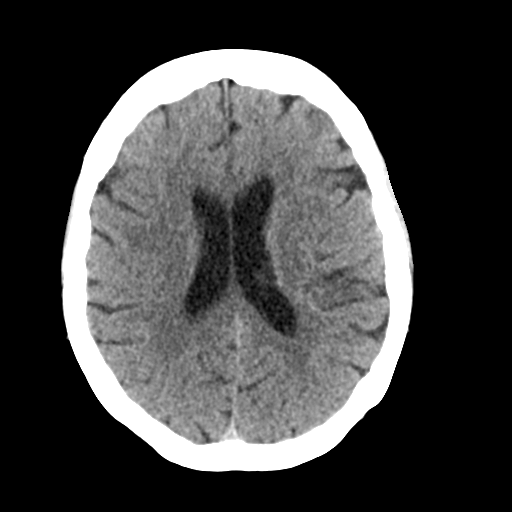
[im 19/33  bone]
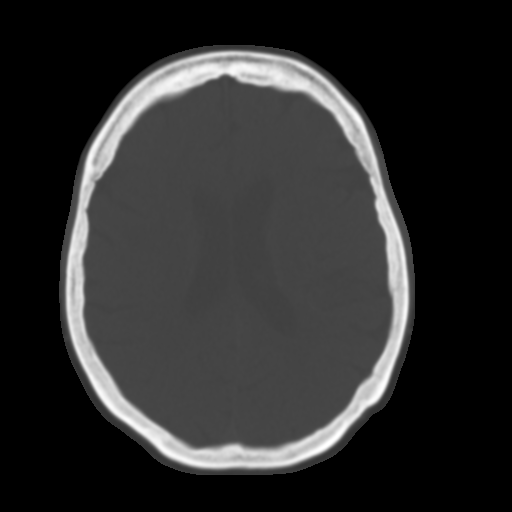
[im 21/33  brain]
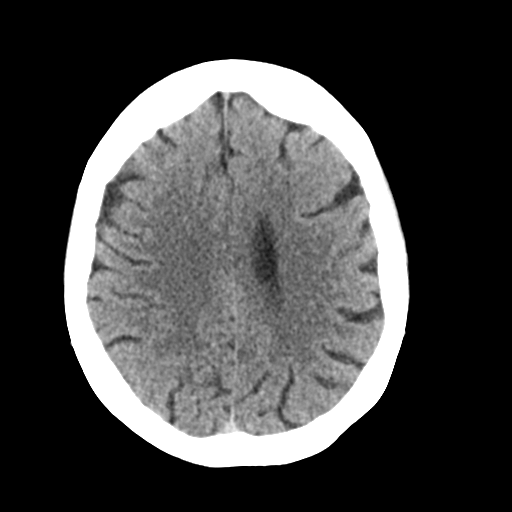
[im 26/33  brain]
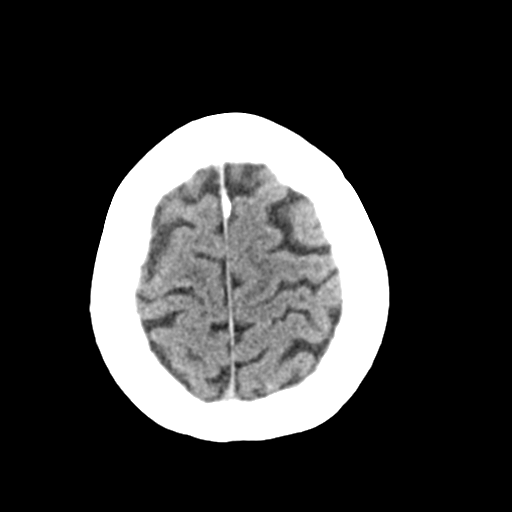
[im 30/33  brain]
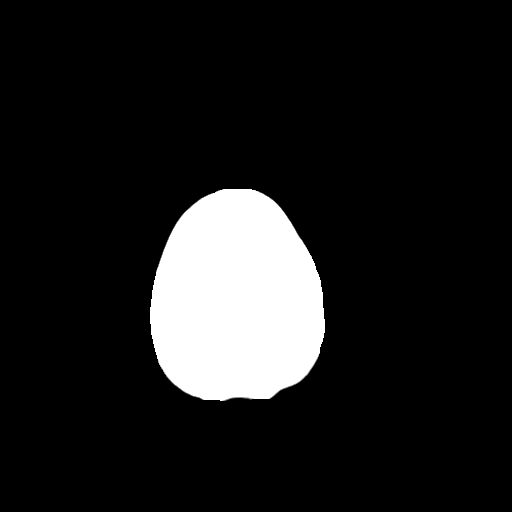

[Series 7: head with 3.0 mpr cor · coronal · 0.32mm/px · 3 of 66 slices shown]
[im 22/66  brain]
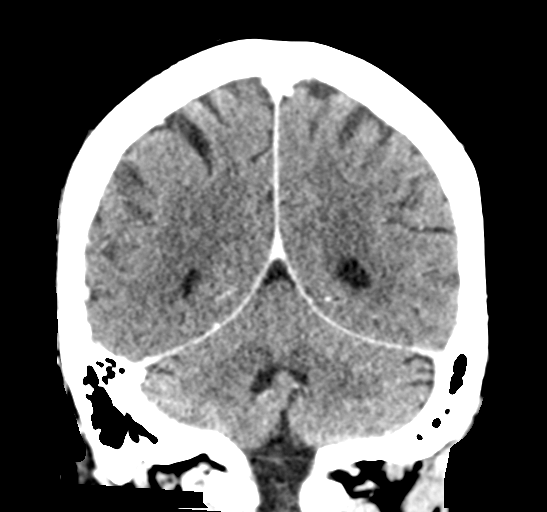
[im 29/66  brain]
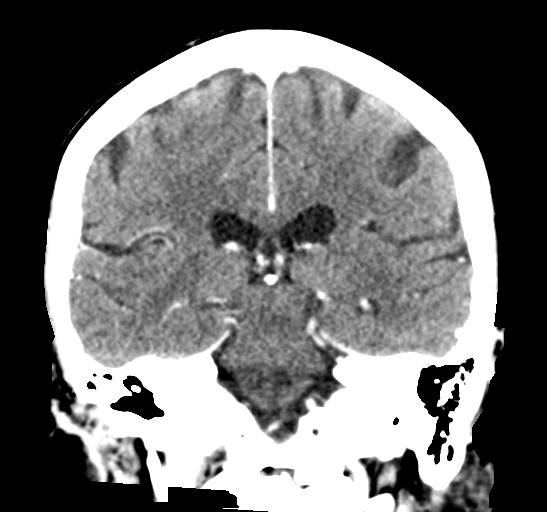
[im 37/66  brain]
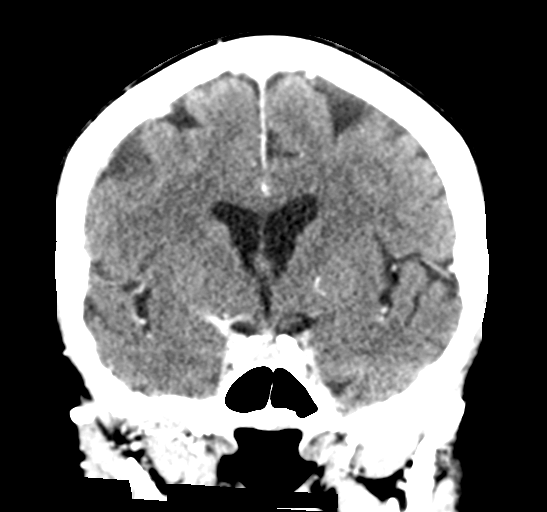

[Series 8: head with 3.0 mpr sag · sagittal · 0.32mm/px · 3 of 59 slices shown]
[im 20/59  brain]
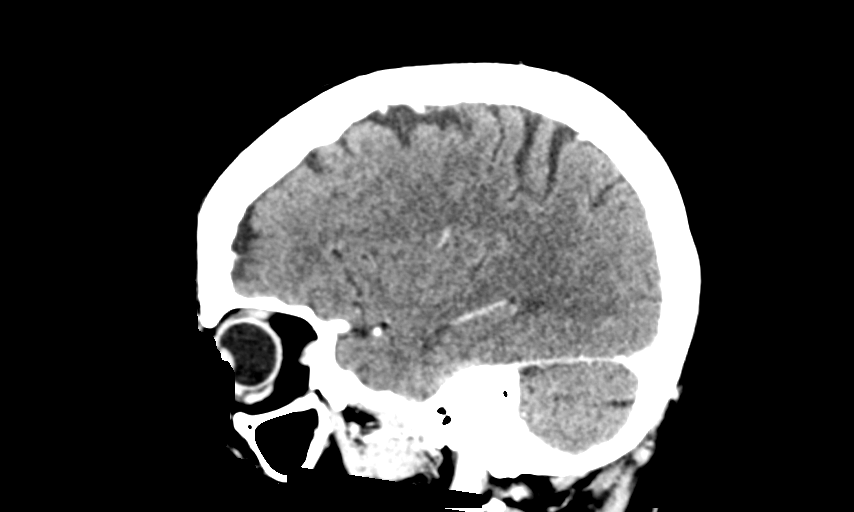
[im 30/59  brain]
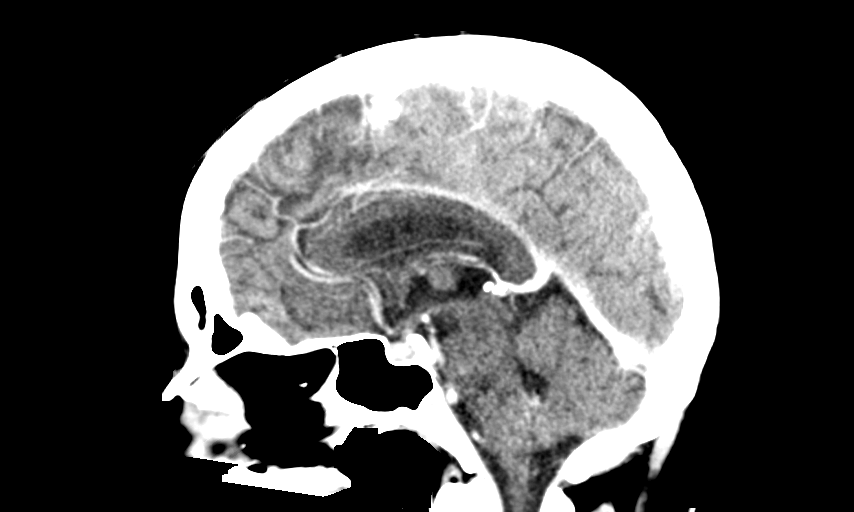
[im 39/59  brain]
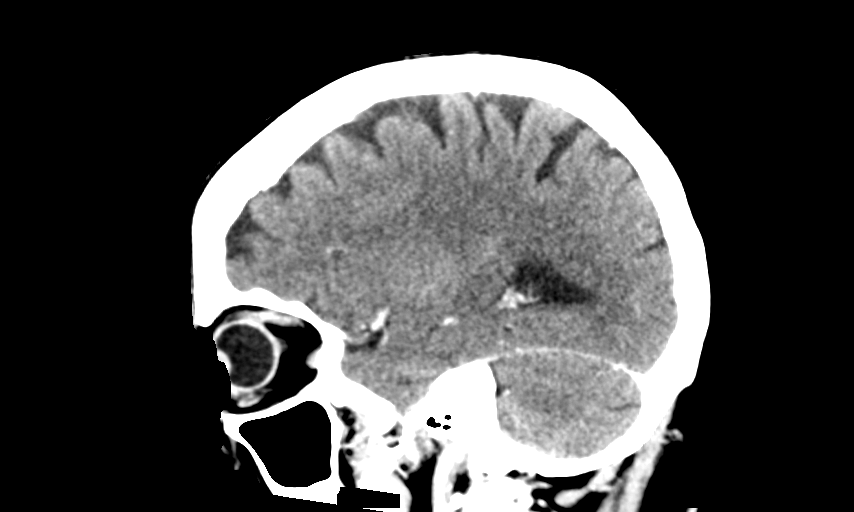

[14 of 47 positions shown; findings below may reference images not displayed]

FINDINGS: Brain: Mild age related volume loss. No sign of old or acute focal
small or large vessel infarction, mass lesion, hemorrhage,
hydrocephalus or extra-axial collection. After contrast
administration no enhancement occurs.

Vascular: Mild atherosclerotic calcification in the carotid siphon
regions.

Skull: Normal

Sinuses/Orbits: Clear/normal

Other: None
IMPRESSION: No acute or focal finding. Mild age related volume loss. Normal
examination for age.

## 2021-03-04 ENCOUNTER — Other Ambulatory Visit: Payer: Self-pay

## 2021-03-04 ENCOUNTER — Ambulatory Visit (INDEPENDENT_AMBULATORY_CARE_PROVIDER_SITE_OTHER): Payer: Medicare Other | Admitting: Cardiovascular Disease

## 2021-03-04 ENCOUNTER — Encounter: Payer: Self-pay | Admitting: Cardiovascular Disease

## 2021-03-04 DIAGNOSIS — R0989 Other specified symptoms and signs involving the circulatory and respiratory systems: Secondary | ICD-10-CM | POA: Diagnosis not present

## 2021-03-04 DIAGNOSIS — R0609 Other forms of dyspnea: Secondary | ICD-10-CM

## 2021-03-04 DIAGNOSIS — I10 Essential (primary) hypertension: Secondary | ICD-10-CM

## 2021-03-04 DIAGNOSIS — E782 Mixed hyperlipidemia: Secondary | ICD-10-CM

## 2021-03-04 DIAGNOSIS — R0600 Dyspnea, unspecified: Secondary | ICD-10-CM | POA: Insufficient documentation

## 2021-03-04 MED ORDER — NITROGLYCERIN 0.4 MG SL SUBL: 0.4000 mg | SUBLINGUAL_TABLET | SUBLINGUAL | 5 refills | Status: DC | PRN

## 2021-03-04 MED ORDER — NITROGLYCERIN 0.4 MG SL SUBL: 0.4000 mg | SUBLINGUAL_TABLET | SUBLINGUAL | 5 refills | Status: AC | PRN

## 2021-03-04 NOTE — Assessment & Plan Note (Signed)
History of hyperlipidemia on atorvastatin with recent lipid profile performed by her PCP 10/10/2020 revealing total cholesterol of 141, LDL 72 and HDL 41.

## 2021-03-04 NOTE — Progress Notes (Signed)
03/04/2021 Bryauna Byrum   07-Dec-1940  [id number]  Primary Physician Street, Sharon Mt, MD Primary Cardiologist: Lorretta Harp MD Lupe Carney, [place]  HPI:  Elizabeth Brown is a 80 y.o.  married Caucasian female with 2 children whose husband [name] is also a patient of mine. I last saw herin the office  01/16/2020. SheHas a history of treated hypertension. She has mild hyperlipidemia not on statin therapy. Her father did die of a myocardial infarction at age 31. She has never had a heart attack or stroke. She had a negative Myoview stress test in our office 01/26/13 as well as a normal 2-D echocardiogram. Carotid Dopplers performed at the same time showed high-grade left external carotid artery stenosis done because of an auscultated bruit. She's had new onset exertional chest pain approximately one month ago occurring several times a week reproducible with mild to moderate exertion. The discomfort radiatedinto her left shoulder arm back and neck.I performedMyoview stress testing 12/26/15 which was entirely normal.She did have a 2D echo performed 07/05/2019 which was entirely normal.  Since I saw her in the office a year ago she continues to do well.  She continues to have some dyspnea on exertion especially when walking up a hill.  She has really had no significant chest pain.  Current Meds  Medication Sig  . amLODipine (NORVASC) 5 MG tablet Take 1 tablet (5 mg total) by mouth daily. Take 1 tab by mouth daily  . gabapentin (NEURONTIN) 300 MG capsule Take 600 mg by mouth every 8 (eight) hours as needed.  . metoprolol succinate (TOPROL-XL) 50 MG 24 hr tablet Take 1 tablet by mouth daily. Take 1 tab by mouth daily  . nitroGLYCERIN (NITROSTAT) 0.4 MG SL tablet Place 1 tablet under the tongue as needed. Place 1 tab under tongue if needed for chest pain  Repeat every 5 minutes up to 3 doses  . pantoprazole (PROTONIX) 40 MG tablet Take 1 tablet by mouth daily. Take 1 tab by mouth daily  .  SYNTHROID 75 MCG tablet Take 1 tablet (75 mcg total) by mouth daily. Take 1 tab by mouth daily  . triamcinolone cream (KENALOG) 0.1 %   . vitamin C (ASCORBIC ACID) 500 MG tablet Take 500 mg by mouth daily.     Allergies  Allergen Reactions  . Sulfa Antibiotics Hives  . Altace [Ramipril] Other (See Comments) and Cough  . Lisinopril Rash  . Shellfish Allergy Hives    Social History   Socioeconomic History  . Marital status: Unknown    Spouse name: Not on file  . Number of children: Not on file  . Years of education: Not on file  . Highest education level: Not on file  Occupational History  . Not on file  Tobacco Use  . Smoking status: Former Smoker    Quit date: 12/19/1990    Years since quitting: 30.2  . Smokeless tobacco: Never Used  Substance and Sexual Activity  . Alcohol use: Not on file  . Drug use: Not on file  . Sexual activity: Not on file  Other Topics Concern  . Not on file  Social History Narrative  . Not on file   Social Determinants of Health   Financial Resource Strain: Not on file  Food Insecurity: Not on file  Transportation Needs: Not on file  Physical Activity: Not on file  Stress: Not on file  Social Connections: Not on file  Intimate Partner Violence: Not on file  Review of Systems: General: negative for chills, fever, night sweats or weight changes.  Cardiovascular: negative for chest pain, dyspnea on exertion, edema, orthopnea, palpitations, paroxysmal nocturnal dyspnea or shortness of breath Dermatological: negative for rash Respiratory: negative for cough or wheezing Urologic: negative for hematuria Abdominal: negative for nausea, vomiting, diarrhea, bright red blood per rectum, melena, or hematemesis Neurologic: negative for visual changes, syncope, or dizziness All other systems reviewed and are otherwise negative except as noted above.    Blood pressure (!) 150/73, pulse 64, height 5\' 2"  (1.575 m), weight 151 lb 3.2 oz (68.6 kg),  SpO2 97 %.  General appearance: alert and no distress Neck: no adenopathy, no JVD, supple, symmetrical, trachea midline, thyroid not enlarged, symmetric, no tenderness/mass/nodules and Left carotid bruit Lungs: clear to auscultation bilaterally Heart: regular rate and rhythm, S1, S2 normal, no murmur, click, rub or gallop Extremities: extremities normal, atraumatic, no cyanosis or edema Pulses: 2+ and symmetric Skin: Skin color, texture, turgor normal. No rashes or lesions Neurologic: Alert and oriented X 3, normal strength and tone. Normal symmetric reflexes. Normal coordination and gait  EKG sinus rhythm at 64 without ST or T wave changes.  I personally reviewed this EKG.  ASSESSMENT AND PLAN:   Essential hypertension History of essential hypertension a blood pressure measured today at 150/73.  She is on amlodipine and metoprolol.  Hyperlipidemia History of hyperlipidemia on atorvastatin with recent lipid profile performed by her PCP 10/10/2020 revealing total cholesterol of 141, LDL 72 and HDL 41.  Left carotid bruit History of left carotid bruit with demonstration of high-grade left external carotid artery stenosis by duplex ultrasound.  Dyspnea on exertion History of dyspnea on exertion unchanged over the years.  Her echo performed 07/05/2019 was entirely normal.  She did have a negative Myoview performed 12/26/2015.  She has not had to use Nitrostat in the last year since I saw her.      Lorretta Harp MD FACP,FACC,FAHA, [facility] 03/04/2021 10:11 AM

## 2021-03-04 NOTE — Patient Instructions (Signed)

## 2021-03-04 NOTE — Assessment & Plan Note (Signed)
History of essential hypertension a blood pressure measured today at 150/73.  She is on amlodipine and metoprolol.

## 2021-03-04 NOTE — Assessment & Plan Note (Signed)
History of left carotid bruit with demonstration of high-grade left external carotid artery stenosis by duplex ultrasound.

## 2021-03-04 NOTE — Assessment & Plan Note (Signed)
History of dyspnea on exertion unchanged over the years.  Her echo performed 07/05/2019 was entirely normal.  She did have a negative Myoview performed 12/26/2015.  She has not had to use Nitrostat in the last year since I saw her.

## 2021-08-15 DIAGNOSIS — Z23 Encounter for immunization: Secondary | ICD-10-CM | POA: Diagnosis not present

## 2021-10-23 DIAGNOSIS — N1831 Chronic kidney disease, stage 3a: Secondary | ICD-10-CM | POA: Diagnosis not present

## 2021-10-23 DIAGNOSIS — I70213 Atherosclerosis of native arteries of extremities with intermittent claudication, bilateral legs: Secondary | ICD-10-CM | POA: Diagnosis not present

## 2021-10-23 DIAGNOSIS — I10 Essential (primary) hypertension: Secondary | ICD-10-CM | POA: Diagnosis not present

## 2021-10-23 DIAGNOSIS — E782 Mixed hyperlipidemia: Secondary | ICD-10-CM | POA: Diagnosis not present

## 2021-10-23 DIAGNOSIS — Z0000 Encounter for general adult medical examination without abnormal findings: Secondary | ICD-10-CM | POA: Diagnosis not present

## 2022-09-11 DIAGNOSIS — Z23 Encounter for immunization: Secondary | ICD-10-CM | POA: Diagnosis not present

## 2022-11-13 DIAGNOSIS — E559 Vitamin D deficiency, unspecified: Secondary | ICD-10-CM | POA: Diagnosis not present

## 2022-11-13 DIAGNOSIS — R7301 Impaired fasting glucose: Secondary | ICD-10-CM | POA: Diagnosis not present

## 2022-11-13 DIAGNOSIS — E039 Hypothyroidism, unspecified: Secondary | ICD-10-CM | POA: Diagnosis not present

## 2022-11-13 DIAGNOSIS — E782 Mixed hyperlipidemia: Secondary | ICD-10-CM | POA: Diagnosis not present

## 2022-11-17 DIAGNOSIS — I10 Essential (primary) hypertension: Secondary | ICD-10-CM | POA: Diagnosis not present

## 2022-11-17 DIAGNOSIS — E782 Mixed hyperlipidemia: Secondary | ICD-10-CM | POA: Diagnosis not present

## 2022-11-17 DIAGNOSIS — Z0000 Encounter for general adult medical examination without abnormal findings: Secondary | ICD-10-CM | POA: Diagnosis not present

## 2022-11-17 DIAGNOSIS — E039 Hypothyroidism, unspecified: Secondary | ICD-10-CM | POA: Diagnosis not present

## 2022-11-17 DIAGNOSIS — F411 Generalized anxiety disorder: Secondary | ICD-10-CM | POA: Diagnosis not present

## 2023-08-12 DIAGNOSIS — Z23 Encounter for immunization: Secondary | ICD-10-CM | POA: Diagnosis not present

## 2023-11-17 DIAGNOSIS — E559 Vitamin D deficiency, unspecified: Secondary | ICD-10-CM | POA: Diagnosis not present

## 2023-11-17 DIAGNOSIS — R7301 Impaired fasting glucose: Secondary | ICD-10-CM | POA: Diagnosis not present

## 2023-11-17 DIAGNOSIS — E782 Mixed hyperlipidemia: Secondary | ICD-10-CM | POA: Diagnosis not present

## 2023-11-17 DIAGNOSIS — E039 Hypothyroidism, unspecified: Secondary | ICD-10-CM | POA: Diagnosis not present

## 2023-11-19 DIAGNOSIS — E782 Mixed hyperlipidemia: Secondary | ICD-10-CM | POA: Diagnosis not present

## 2023-11-19 DIAGNOSIS — I10 Essential (primary) hypertension: Secondary | ICD-10-CM | POA: Diagnosis not present

## 2023-11-19 DIAGNOSIS — N1831 Chronic kidney disease, stage 3a: Secondary | ICD-10-CM | POA: Diagnosis not present

## 2023-11-19 DIAGNOSIS — Z0000 Encounter for general adult medical examination without abnormal findings: Secondary | ICD-10-CM | POA: Diagnosis not present

## 2023-11-19 DIAGNOSIS — E039 Hypothyroidism, unspecified: Secondary | ICD-10-CM | POA: Diagnosis not present

## 2024-02-15 DIAGNOSIS — I10 Essential (primary) hypertension: Secondary | ICD-10-CM | POA: Diagnosis not present

## 2024-02-15 DIAGNOSIS — K219 Gastro-esophageal reflux disease without esophagitis: Secondary | ICD-10-CM | POA: Diagnosis not present

## 2024-02-15 DIAGNOSIS — Z6827 Body mass index (BMI) 27.0-27.9, adult: Secondary | ICD-10-CM | POA: Diagnosis not present

## 2024-02-15 DIAGNOSIS — E039 Hypothyroidism, unspecified: Secondary | ICD-10-CM | POA: Diagnosis not present

## 2024-02-15 DIAGNOSIS — N390 Urinary tract infection, site not specified: Secondary | ICD-10-CM | POA: Diagnosis not present

## 2024-02-15 DIAGNOSIS — E785 Hyperlipidemia, unspecified: Secondary | ICD-10-CM | POA: Diagnosis not present

## 2024-02-15 DIAGNOSIS — M549 Dorsalgia, unspecified: Secondary | ICD-10-CM | POA: Diagnosis not present

## 2024-02-15 DIAGNOSIS — R079 Chest pain, unspecified: Secondary | ICD-10-CM | POA: Diagnosis not present

## 2024-06-20 DIAGNOSIS — E785 Hyperlipidemia, unspecified: Secondary | ICD-10-CM | POA: Diagnosis not present

## 2024-06-20 DIAGNOSIS — E039 Hypothyroidism, unspecified: Secondary | ICD-10-CM | POA: Diagnosis not present

## 2024-06-20 DIAGNOSIS — Z6825 Body mass index (BMI) 25.0-25.9, adult: Secondary | ICD-10-CM | POA: Diagnosis not present

## 2024-06-20 DIAGNOSIS — I10 Essential (primary) hypertension: Secondary | ICD-10-CM | POA: Diagnosis not present

## 2024-06-20 DIAGNOSIS — K219 Gastro-esophageal reflux disease without esophagitis: Secondary | ICD-10-CM | POA: Diagnosis not present

## 2024-06-20 DIAGNOSIS — R42 Dizziness and giddiness: Secondary | ICD-10-CM | POA: Diagnosis not present

## 2024-08-15 DIAGNOSIS — Z23 Encounter for immunization: Secondary | ICD-10-CM | POA: Diagnosis not present

## 2024-08-26 DIAGNOSIS — R051 Acute cough: Secondary | ICD-10-CM | POA: Diagnosis not present

## 2024-08-26 DIAGNOSIS — R070 Pain in throat: Secondary | ICD-10-CM | POA: Diagnosis not present
